# Patient Record
Sex: Female | Born: 1980 | Race: Black or African American | Hispanic: No | Smoking: Former smoker
Health system: Southern US, Community
[De-identification: ages and names within clinical notes are randomized; demographics above are authoritative.]

## PROBLEM LIST (undated history)

## (undated) DIAGNOSIS — Z789 Other specified health status: Secondary | ICD-10-CM

## (undated) DIAGNOSIS — K219 Gastro-esophageal reflux disease without esophagitis: Secondary | ICD-10-CM

## (undated) DIAGNOSIS — K4090 Unilateral inguinal hernia, without obstruction or gangrene, not specified as recurrent: Secondary | ICD-10-CM

## (undated) HISTORY — DX: Gastro-esophageal reflux disease without esophagitis: K21.9

## (undated) HISTORY — DX: Unilateral inguinal hernia, without obstruction or gangrene, not specified as recurrent: K40.90

---

## 2003-09-18 ENCOUNTER — Other Ambulatory Visit: Admission: RE | Admit: 2003-09-18 | Discharge: 2003-09-18 | Payer: Self-pay | Admitting: Gynecology

## 2004-06-25 ENCOUNTER — Other Ambulatory Visit: Admission: RE | Admit: 2004-06-25 | Discharge: 2004-06-25 | Payer: Self-pay | Admitting: Obstetrics and Gynecology

## 2016-12-15 HISTORY — PX: INGUINAL HERNIA REPAIR: SUR1180

## 2017-12-15 HISTORY — PX: SHOULDER SURGERY: SHX246

## 2020-05-10 ENCOUNTER — Other Ambulatory Visit: Payer: Self-pay

## 2020-05-10 ENCOUNTER — Ambulatory Visit: Payer: Medicaid Other | Admitting: Obstetrics and Gynecology

## 2020-05-10 ENCOUNTER — Encounter: Payer: Self-pay | Admitting: Obstetrics and Gynecology

## 2020-05-10 VITALS — BP 120/76 | HR 80 | Ht 64.0 in | Wt 173.5 lb

## 2020-05-10 DIAGNOSIS — N644 Mastodynia: Secondary | ICD-10-CM

## 2020-05-10 DIAGNOSIS — R635 Abnormal weight gain: Secondary | ICD-10-CM

## 2020-05-10 DIAGNOSIS — N938 Other specified abnormal uterine and vaginal bleeding: Secondary | ICD-10-CM | POA: Diagnosis not present

## 2020-05-10 DIAGNOSIS — R102 Pelvic and perineal pain: Secondary | ICD-10-CM | POA: Diagnosis not present

## 2020-05-10 NOTE — Progress Notes (Signed)
Patient presents as New Patient. She complains of having lower abdominal pain, bilateral breast tenderness and swelling. She states that she was diagnosed with endometriosis in 2018 in the [employer]. She also complains of having vaginal discharge.

## 2020-05-10 NOTE — Progress Notes (Signed)
Patient ID: Katrina Hurley, female   DOB: 1981/09/09, 39 y.o.   MRN: [id number] Katrina Hurley presents with several compliants today.  She has noted a 45 # weight gain since November. Increase in her dress size and abd girth. She denies any N/V, bowel or bladder dysfunction.  She also reports irregular cycles until this past November. Cycles since have been monthly and last 4-7 days. Currently on her cycle now. She states no overly heavy or painful. She does report a H/O ovarian cysts in the past as well as ? Dx of endometriosis in the past. Has tried OCP's in the past for cycle control, but noted no difference.  She also reports bilateral breast pain and swelling for the last several months. Denies any nipple discharge, injury, excessive caffeine use or FH of breast CA.   Not sexual active at present. H/O GC/C at age 74  H/O TSVD x 1 and C section d/t placental abruption  Last pap several yrs ago.  PE AF VSS Lungs clear Heart RRR Abd soft + BS  A/P Weight gain        Breast Pain         Abd/pelvic pain         H/O DUB         ? Endometriosis  Will check labs. GYN U/S. Dx mammogram F/U in 4 weeks for review of test results, and yearly GYN exam

## 2020-05-10 NOTE — Patient Instructions (Signed)
Health Maintenance, Female Adopting a healthy lifestyle and getting preventive care are important in promoting health and wellness. Ask your health care provider about:  The right schedule for you to have regular tests and exams.  Things you can do on your own to prevent diseases and keep yourself healthy. What should I know about diet, weight, and exercise? Eat a healthy diet   Eat a diet that includes plenty of vegetables, fruits, low-fat dairy products, and lean protein.  Do not eat a lot of foods that are high in solid fats, added sugars, or sodium. Maintain a healthy weight Body mass index (BMI) is used to identify weight problems. It estimates body fat based on height and weight. Your health care provider can help determine your BMI and help you achieve or maintain a healthy weight. Get regular exercise Get regular exercise. This is one of the most important things you can do for your health. Most adults should:  Exercise for at least 150 minutes each week. The exercise should increase your heart rate and make you sweat (moderate-intensity exercise).  Do strengthening exercises at least twice a week. This is in addition to the moderate-intensity exercise.  Spend less time sitting. Even light physical activity can be beneficial. Watch cholesterol and blood lipids Have your blood tested for lipids and cholesterol at 38 years of age, then have this test every 5 years. Have your cholesterol levels checked more often if:  Your lipid or cholesterol levels are high.  You are older than 38 years of age.  You are at high risk for heart disease. What should I know about cancer screening? Depending on your health history and family history, you may need to have cancer screening at various ages. This may include screening for:  Breast cancer.  Cervical cancer.  Colorectal cancer.  Skin cancer.  Lung cancer. What should I know about heart disease, diabetes, and high blood  pressure? Blood pressure and heart disease  High blood pressure causes heart disease and increases the risk of stroke. This is more likely to develop in people who have high blood pressure readings, are of African descent, or are overweight.  Have your blood pressure checked: ? Every 3-5 years if you are 18-39 years of age. ? Every year if you are 40 years old or older. Diabetes Have regular diabetes screenings. This checks your fasting blood sugar level. Have the screening done:  Once every three years after age 40 if you are at a normal weight and have a low risk for diabetes.  More often and at a younger age if you are overweight or have a high risk for diabetes. What should I know about preventing infection? Hepatitis B If you have a higher risk for hepatitis B, you should be screened for this virus. Talk with your health care provider to find out if you are at risk for hepatitis B infection. Hepatitis C Testing is recommended for:  Everyone born from 1945 through 1965.  Anyone with known risk factors for hepatitis C. Sexually transmitted infections (STIs)  Get screened for STIs, including gonorrhea and chlamydia, if: ? You are sexually active and are younger than 38 years of age. ? You are older than 38 years of age and your health care provider tells you that you are at risk for this type of infection. ? Your sexual activity has changed since you were last screened, and you are at increased risk for chlamydia or gonorrhea. Ask your health care provider if   you are at risk.  Ask your health care provider about whether you are at high risk for HIV. Your health care provider may recommend a prescription medicine to help prevent HIV infection. If you choose to take medicine to prevent HIV, you should first get tested for HIV. You should then be tested every 3 months for as long as you are taking the medicine. Pregnancy  If you are about to stop having your period (premenopausal) and  you may become pregnant, seek counseling before you get pregnant.  Take 400 to 800 micrograms (mcg) of folic acid every day if you become pregnant.  Ask for birth control (contraception) if you want to prevent pregnancy. Osteoporosis and menopause Osteoporosis is a disease in which the bones lose minerals and strength with aging. This can result in bone fractures. If you are 65 years old or older, or if you are at risk for osteoporosis and fractures, ask your health care provider if you should:  Be screened for bone loss.  Take a calcium or vitamin D supplement to lower your risk of fractures.  Be given hormone replacement therapy (HRT) to treat symptoms of menopause. Follow these instructions at home: Lifestyle  Do not use any products that contain nicotine or tobacco, such as cigarettes, e-cigarettes, and chewing tobacco. If you need help quitting, ask your health care provider.  Do not use street drugs.  Do not share needles.  Ask your health care provider for help if you need support or information about quitting drugs. Alcohol use  Do not drink alcohol if: ? Your health care provider tells you not to drink. ? You are pregnant, may be pregnant, or are planning to become pregnant.  If you drink alcohol: ? Limit how much you use to 0-1 drink a day. ? Limit intake if you are breastfeeding.  Be aware of how much alcohol is in your drink. In the [place] one drink equals one 12 oz bottle of beer (355 mL), one 5 oz glass of wine (148 mL), or one 1 oz glass of hard liquor (44 mL). General instructions  Schedule regular health, dental, and eye exams.  Stay current with your vaccines.  Tell your health care provider if: ? You often feel depressed. ? You have ever been abused or do not feel safe at home. Summary  Adopting a healthy lifestyle and getting preventive care are important in promoting health and wellness.  Follow your health care provider's instructions about healthy  diet, exercising, and getting tested or screened for diseases.  Follow your health care provider's instructions on monitoring your cholesterol and blood pressure. This information is not intended to replace advice given to you by your health care provider. Make sure you discuss any questions you have with your health care provider. Document Revised: 11/24/2018 Document Reviewed: 11/24/2018 Elsevier Patient Education  2020 Elsevier Inc.  

## 2020-05-11 LAB — HEMOGLOBIN A1C
Est. average glucose Bld gHb Est-mCnc: 103 mg/dL
Hgb A1c MFr Bld: 5.2 % (ref 4.8–5.6)

## 2020-05-11 LAB — COMPREHENSIVE METABOLIC PANEL
ALT: 15 IU/L (ref 0–32)
AST: 16 IU/L (ref 0–40)
Albumin/Globulin Ratio: 1.7 (ref 1.2–2.2)
Albumin: 4.3 g/dL (ref 3.8–4.8)
Alkaline Phosphatase: 53 IU/L (ref 48–121)
BUN/Creatinine Ratio: 14 (ref 9–23)
BUN: 11 mg/dL (ref 6–20)
Bilirubin Total: 0.5 mg/dL (ref 0.0–1.2)
CO2: 26 mmol/L (ref 20–29)
Calcium: 9.4 mg/dL (ref 8.7–10.2)
Chloride: 103 mmol/L (ref 96–106)
Creatinine, Ser: 0.81 mg/dL (ref 0.57–1.00)
GFR calc Af Amer: 107 mL/min/{1.73_m2} (ref >59)
GFR calc non Af Amer: 93 mL/min/{1.73_m2} (ref >59)
Globulin, Total: 2.5 g/dL (ref 1.5–4.5)
Glucose: 78 mg/dL (ref 65–99)
Potassium: 4.1 mmol/L (ref 3.5–5.2)
Sodium: 141 mmol/L (ref 134–144)
Total Protein: 6.8 g/dL (ref 6.0–8.5)

## 2020-05-11 LAB — CBC
Hematocrit: 37.8 % (ref 34.0–46.6)
Hemoglobin: 12.4 g/dL (ref 11.1–15.9)
MCH: 25.8 pg — ABNORMAL LOW (ref 26.6–33.0)
MCHC: 32.8 g/dL (ref 31.5–35.7)
MCV: 79 fL (ref 79–97)
Platelets: 300 10*3/uL (ref 150–450)
RBC: 4.81 x10E6/uL (ref 3.77–5.28)
RDW: 13 % (ref 11.7–15.4)
WBC: 5.5 10*3/uL (ref 3.4–10.8)

## 2020-05-11 LAB — TSH: TSH: 1.39 u[IU]/mL (ref 0.450–4.500)

## 2020-05-18 ENCOUNTER — Ambulatory Visit (HOSPITAL_COMMUNITY): Payer: Medicaid Other

## 2020-06-01 ENCOUNTER — Ambulatory Visit (HOSPITAL_COMMUNITY)
Admission: RE | Admit: 2020-06-01 | Discharge: 2020-06-01 | Disposition: A | Discharge status: Home / Self Care | Payer: Medicaid Other | Source: Ambulatory Visit | Attending: Obstetrics and Gynecology | Admitting: Obstetrics and Gynecology

## 2020-06-01 DIAGNOSIS — R102 Pelvic and perineal pain: Secondary | ICD-10-CM | POA: Diagnosis not present

## 2020-06-05 ENCOUNTER — Telehealth: Payer: Self-pay

## 2020-06-05 NOTE — Telephone Encounter (Signed)
Pt left a vm requesting results from recent ultrasound on 05/06/20.   I consulted with with Dr. Donavan Foil.  "Small cyst noted on the right ovary. This is likely causing her pain and discomfort.  She can take tylenol and use a heating pad/warm compresses PRN.  Keep f/u in 06/2020.  If she feels that she needs to be seen sooner that is ok as well."  Pt verbalized understanding. -EH/RMA

## 2020-07-05 ENCOUNTER — Ambulatory Visit: Payer: Medicaid Other | Admitting: Obstetrics and Gynecology

## 2020-07-10 ENCOUNTER — Encounter: Payer: Self-pay | Admitting: Obstetrics and Gynecology

## 2020-07-10 ENCOUNTER — Other Ambulatory Visit (HOSPITAL_COMMUNITY)
Admission: RE | Admit: 2020-07-10 | Discharge: 2020-07-10 | Disposition: A | Discharge status: Home / Self Care | Payer: Medicaid Other | Source: Ambulatory Visit | Attending: Obstetrics and Gynecology | Admitting: Obstetrics and Gynecology

## 2020-07-10 ENCOUNTER — Other Ambulatory Visit: Payer: Self-pay

## 2020-07-10 ENCOUNTER — Ambulatory Visit: Payer: Medicaid Other | Admitting: Obstetrics and Gynecology

## 2020-07-10 VITALS — BP 107/67 | HR 76 | Ht 64.0 in | Wt 182.2 lb

## 2020-07-10 DIAGNOSIS — N83202 Unspecified ovarian cyst, left side: Secondary | ICD-10-CM

## 2020-07-10 DIAGNOSIS — Z01419 Encounter for gynecological examination (general) (routine) without abnormal findings: Secondary | ICD-10-CM | POA: Insufficient documentation

## 2020-07-10 DIAGNOSIS — R635 Abnormal weight gain: Secondary | ICD-10-CM | POA: Diagnosis not present

## 2020-07-10 DIAGNOSIS — N83209 Unspecified ovarian cyst, unspecified side: Secondary | ICD-10-CM | POA: Insufficient documentation

## 2020-07-10 DIAGNOSIS — N938 Other specified abnormal uterine and vaginal bleeding: Secondary | ICD-10-CM | POA: Diagnosis not present

## 2020-07-10 DIAGNOSIS — N644 Mastodynia: Secondary | ICD-10-CM

## 2020-07-10 DIAGNOSIS — R102 Pelvic and perineal pain: Secondary | ICD-10-CM | POA: Diagnosis not present

## 2020-07-10 DIAGNOSIS — Z3202 Encounter for pregnancy test, result negative: Secondary | ICD-10-CM | POA: Diagnosis not present

## 2020-07-10 LAB — POCT URINE PREGNANCY: Preg Test, Ur: NEGATIVE

## 2020-07-10 MED ORDER — DESOGESTREL-ETHINYL ESTRADIOL 0.15-30 MG-MCG PO TABS: 1.0000 | ORAL_TABLET | Freq: Every day | ORAL | 11 refills | Status: DC

## 2020-07-10 NOTE — Progress Notes (Signed)
Pt presents for follow up lab work and pelvic u/s. Annual and pap due today Pt c/o excessive weight gain, bilateral breast "soreness, and abdominal pain "feels like contractions." She also feels like something is expelling out of her vagina.

## 2020-07-10 NOTE — Patient Instructions (Signed)
Health Maintenance, Female Adopting a healthy lifestyle and getting preventive care are important in promoting health and wellness. Ask your health care provider about:  The right schedule for you to have regular tests and exams.  Things you can do on your own to prevent diseases and keep yourself healthy. What should I know about diet, weight, and exercise? Eat a healthy diet   Eat a diet that includes plenty of vegetables, fruits, low-fat dairy products, and lean protein.  Do not eat a lot of foods that are high in solid fats, added sugars, or sodium. Maintain a healthy weight Body mass index (BMI) is used to identify weight problems. It estimates body fat based on height and weight. Your health care provider can help determine your BMI and help you achieve or maintain a healthy weight. Get regular exercise Get regular exercise. This is one of the most important things you can do for your health. Most adults should:  Exercise for at least 150 minutes each week. The exercise should increase your heart rate and make you sweat (moderate-intensity exercise).  Do strengthening exercises at least twice a week. This is in addition to the moderate-intensity exercise.  Spend less time sitting. Even light physical activity can be beneficial. Watch cholesterol and blood lipids Have your blood tested for lipids and cholesterol at 38 years of age, then have this test every 5 years. Have your cholesterol levels checked more often if:  Your lipid or cholesterol levels are high.  You are older than 38 years of age.  You are at high risk for heart disease. What should I know about cancer screening? Depending on your health history and family history, you may need to have cancer screening at various ages. This may include screening for:  Breast cancer.  Cervical cancer.  Colorectal cancer.  Skin cancer.  Lung cancer. What should I know about heart disease, diabetes, and high blood  pressure? Blood pressure and heart disease  High blood pressure causes heart disease and increases the risk of stroke. This is more likely to develop in people who have high blood pressure readings, are of African descent, or are overweight.  Have your blood pressure checked: ? Every 3-5 years if you are 18-39 years of age. ? Every year if you are 40 years old or older. Diabetes Have regular diabetes screenings. This checks your fasting blood sugar level. Have the screening done:  Once every three years after age 40 if you are at a normal weight and have a low risk for diabetes.  More often and at a younger age if you are overweight or have a high risk for diabetes. What should I know about preventing infection? Hepatitis B If you have a higher risk for hepatitis B, you should be screened for this virus. Talk with your health care provider to find out if you are at risk for hepatitis B infection. Hepatitis C Testing is recommended for:  Everyone born from 1945 through 1965.  Anyone with known risk factors for hepatitis C. Sexually transmitted infections (STIs)  Get screened for STIs, including gonorrhea and chlamydia, if: ? You are sexually active and are younger than 38 years of age. ? You are older than 38 years of age and your health care provider tells you that you are at risk for this type of infection. ? Your sexual activity has changed since you were last screened, and you are at increased risk for chlamydia or gonorrhea. Ask your health care provider if   you are at risk.  Ask your health care provider about whether you are at high risk for HIV. Your health care provider may recommend a prescription medicine to help prevent HIV infection. If you choose to take medicine to prevent HIV, you should first get tested for HIV. You should then be tested every 3 months for as long as you are taking the medicine. Pregnancy  If you are about to stop having your period (premenopausal) and  you may become pregnant, seek counseling before you get pregnant.  Take 400 to 800 micrograms (mcg) of folic acid every day if you become pregnant.  Ask for birth control (contraception) if you want to prevent pregnancy. Osteoporosis and menopause Osteoporosis is a disease in which the bones lose minerals and strength with aging. This can result in bone fractures. If you are 65 years old or older, or if you are at risk for osteoporosis and fractures, ask your health care provider if you should:  Be screened for bone loss.  Take a calcium or vitamin D supplement to lower your risk of fractures.  Be given hormone replacement therapy (HRT) to treat symptoms of menopause. Follow these instructions at home: Lifestyle  Do not use any products that contain nicotine or tobacco, such as cigarettes, e-cigarettes, and chewing tobacco. If you need help quitting, ask your health care provider.  Do not use street drugs.  Do not share needles.  Ask your health care provider for help if you need support or information about quitting drugs. Alcohol use  Do not drink alcohol if: ? Your health care provider tells you not to drink. ? You are pregnant, may be pregnant, or are planning to become pregnant.  If you drink alcohol: ? Limit how much you use to 0-1 drink a day. ? Limit intake if you are breastfeeding.  Be aware of how much alcohol is in your drink. In the [place] one drink equals one 12 oz bottle of beer (355 mL), one 5 oz glass of wine (148 mL), or one 1 oz glass of hard liquor (44 mL). General instructions  Schedule regular health, dental, and eye exams.  Stay current with your vaccines.  Tell your health care provider if: ? You often feel depressed. ? You have ever been abused or do not feel safe at home. Summary  Adopting a healthy lifestyle and getting preventive care are important in promoting health and wellness.  Follow your health care provider's instructions about healthy  diet, exercising, and getting tested or screened for diseases.  Follow your health care provider's instructions on monitoring your cholesterol and blood pressure. This information is not intended to replace advice given to you by your health care provider. Make sure you discuss any questions you have with your health care provider. Document Revised: 11/24/2018 Document Reviewed: 11/24/2018 Elsevier Patient Education  2020 Elsevier Inc.  

## 2020-07-10 NOTE — Progress Notes (Signed)
Patient ID: Katrina Hurley, female   DOB: 1981-08-20, 39 y.o.   MRN: [id number]  Katrina Hurley is a 39 y.o. G86P2002 female here for a routine annual gynecologic exam. She continues to have problems with bilateral breast pain. Has not had Dx mammogram yet. Cycles are irregular now. Last IC this past March. Feels bloated. 9 # wt gain since last visit. Labs from last visit normal. U/S normal except for left ovarian cyst.    Has used OCP's in the past without problems   Gynecologic History Patient's last menstrual period was 06/19/2020. Contraception: none   Obstetric History OB History  Gravida Para Term Preterm AB Living  2 2 2     2   SAB TAB Ectopic Multiple Live Births          2    # Outcome Date GA Lbr Len/2nd Weight Sex Delivery Anes PTL Lv  2 Term 07/17/10     CS-LTranv   LIV  1 Term 12/02/08     Vag-Spont   LIV    History reviewed. No pertinent past medical history.  Past Surgical History:  Procedure Laterality Date  . CESAREAN SECTION  07/2010  . INGUINAL HERNIA REPAIR  2018  . SHOULDER SURGERY Left 2019    Current Outpatient Medications on File Prior to Visit  Medication Sig Dispense Refill  . Multiple Vitamins-Minerals (MULTIVITAMIN GUMMIES WOMENS) CHEW Chew by mouth.     No current facility-administered medications on file prior to visit.    Allergies  Allergen Reactions  . Prunus Persica Anaphylaxis  . Shellfish Allergy Anaphylaxis    Social History   Socioeconomic History  . Marital status: Divorced    Spouse name: Not on file  . Number of children: Not on file  . Years of education: Not on file  . Highest education level: Not on file  Occupational History  . Not on file  Tobacco Use  . Smoking status: Former Smoker    Types: Cigarettes    Quit date: 2012    Years since quitting: 9.5  . Smokeless tobacco: Never Used  Vaping Use  . Vaping Use: Never used  Substance and Sexual Activity  . Alcohol use: Not Currently  . Drug use: Not Currently  .  Sexual activity: Not Currently    Partners: Male    Birth control/protection: None  Other Topics Concern  . Not on file  Social History Narrative  . Not on file   Social Determinants of Health   Financial Resource Strain:   . Difficulty of Paying Living Expenses:   Food Insecurity:   . Worried About 2013 in the Last Year:   . Programme researcher, broadcasting/film/video in the Last Year:   Transportation Needs:   . Barista (Medical):   Freight forwarder Lack of Transportation (Non-Medical):   Physical Activity:   . Days of Exercise per Week:   . Minutes of Exercise per Session:   Stress:   . Feeling of Stress :   Social Connections:   . Frequency of Communication with Friends and Family:   . Frequency of Social Gatherings with Friends and Family:   . Attends Religious Services:   . Active Member of Clubs or Organizations:   . Attends [employer] Meetings:   Banker Marital Status:   Intimate Partner Violence:   . Fear of Current or Ex-Partner:   . Emotionally Abused:   [employer] Physically Abused:   . Sexually Abused:  History reviewed. No pertinent family history.  The following portions of the patient's history were reviewed and updated as appropriate: allergies, current medications, past family history, past medical history, past social history, past surgical history and problem list.  Review of Systems Pertinent items noted in HPI and remainder of comprehensive ROS otherwise negative.   Objective:  BP 107/67   Pulse 76   Ht 5\' 4"  (1.626 m)   Wt 182 lb 3.2 oz (82.6 kg)   LMP 06/19/2020   BMI 31.27 kg/m  CONSTITUTIONAL: Well-developed, well-nourished female in no acute distress.  HENT:  Normocephalic, atraumatic, External right and left ear normal. Oropharynx is clear and moist EYES: Conjunctivae and EOM are normal. Pupils are equal, round, and reactive to light. No scleral icterus.  NECK: Normal range of motion, supple, no masses.  Normal thyroid.  SKIN: Skin is warm and  dry. No rash noted. Not diaphoretic. No erythema. No pallor. NEUROLGIC: Alert and oriented to person, place, and time. Normal reflexes, muscle tone coordination. No cranial nerve deficit noted. PSYCHIATRIC: Normal mood and affect. Normal behavior. Normal judgment and thought content. CARDIOVASCULAR: Normal heart rate noted, regular rhythm RESPIRATORY: Clear to auscultation bilaterally. Effort and breath sounds normal, no problems with respiration noted. BREASTS: Symmetric in size. No masses, skin changes, nipple drainage, or lymphadenopathy. ABDOMEN: Soft, normal bowel sounds, no distention noted.  No tenderness, rebound or guarding.  PELVIC: Normal appearing external genitalia; normal appearing vaginal mucosa and cervix.  No abnormal discharge noted.  Pap smear obtained.  Normal uterine size, no other palpable masses, no uterine or adnexal tenderness. MUSCULOSKELETAL: Normal range of motion. No tenderness.  No cyanosis, clubbing, or edema.  2+ distal pulses.   Assessment:  Annual gynecologic examination with pap smear DUB Breast tenderness Left ovarian cyst Plan:  Will follow up results of pap smear and manage accordingly. U/S findings reviewed with pt. F/U U/S ordered. Suspect Sx partially hormonally related. Discussed OCP's to help regulate cycles and hormones. Pt agreeable. Will start OCP's. U/R/B and back up method reviewed with pt. Dx mammogram rescheudle Routine preventative health maintenance measures emphasized. Please refer to After Visit Summary for other counseling recommendations.  F/U in 3-4 months  08/20/2020, MD, FACOG Attending Obstetrician & Gynecologist Center for [facility], [facility]

## 2020-07-10 NOTE — Addendum Note (Signed)
Addended by: Dalphine Handing on: 07/10/2020 03:19 PM   Modules accepted: Orders

## 2020-07-11 LAB — CERVICOVAGINAL ANCILLARY ONLY
Bacterial Vaginitis (gardnerella): NEGATIVE
Candida Glabrata: NEGATIVE
Candida Vaginitis: POSITIVE — AB
Chlamydia: NEGATIVE
Comment: NEGATIVE
Comment: NEGATIVE
Comment: NEGATIVE
Comment: NEGATIVE
Comment: NEGATIVE
Comment: NORMAL
Neisseria Gonorrhea: NEGATIVE
Trichomonas: NEGATIVE

## 2020-07-12 ENCOUNTER — Other Ambulatory Visit: Payer: Self-pay

## 2020-07-12 DIAGNOSIS — B3731 Acute candidiasis of vulva and vagina: Secondary | ICD-10-CM

## 2020-07-12 LAB — CYTOLOGY - PAP
Comment: NEGATIVE
Diagnosis: NEGATIVE
High risk HPV: NEGATIVE

## 2020-07-12 MED ORDER — FLUCONAZOLE 150 MG PO TABS: 150.0000 mg | ORAL_TABLET | Freq: Once | ORAL | 1 refills | Status: AC

## 2020-07-12 NOTE — Progress Notes (Signed)
Rx sent as advised by Dr.Ervin.   

## 2020-07-17 ENCOUNTER — Other Ambulatory Visit: Payer: Self-pay

## 2020-07-17 ENCOUNTER — Ambulatory Visit
Admission: RE | Admit: 2020-07-17 | Discharge: 2020-07-17 | Disposition: A | Discharge status: Home / Self Care | Payer: Medicaid Other | Source: Ambulatory Visit | Attending: Obstetrics and Gynecology | Admitting: Obstetrics and Gynecology

## 2020-07-17 DIAGNOSIS — N83202 Unspecified ovarian cyst, left side: Secondary | ICD-10-CM | POA: Diagnosis present

## 2021-12-24 IMAGING — US US PELVIS COMPLETE WITH TRANSVAGINAL
1 series · 13 of 25 positions shown · non-contrast
Comparison: None

CLINICAL DATA: Abdomen and pelvic pain

EXAM:
TRANSABDOMINAL AND TRANSVAGINAL ULTRASOUND OF PELVIS
TECHNIQUE: Both transabdominal and transvaginal ultrasound examinations of the
pelvis were performed. Transabdominal technique was performed for
global imaging of the pelvis including uterus, ovaries, adnexal
regions, and pelvic cul-de-sac. It was necessary to proceed with
endovaginal exam following the transabdominal exam to visualize the
uterus endometrium ovaries.

[Series 1: us pelvis complete with transvaginal · 13 of 103 slices shown]
[im 1/103]
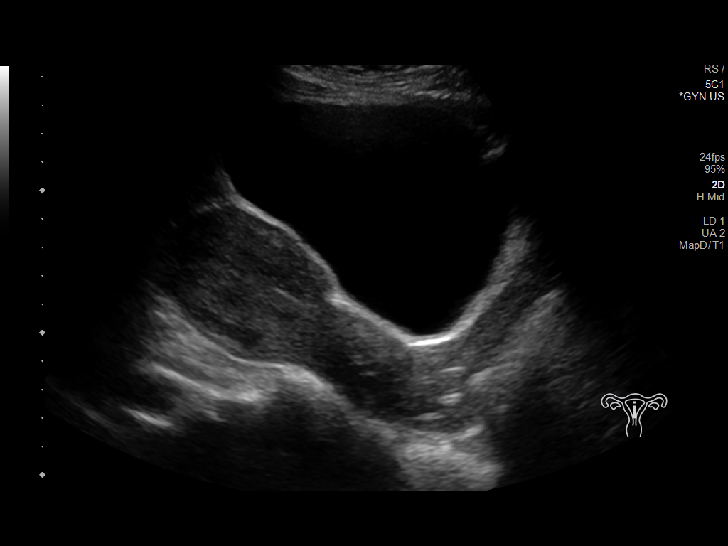
[im 9/103]
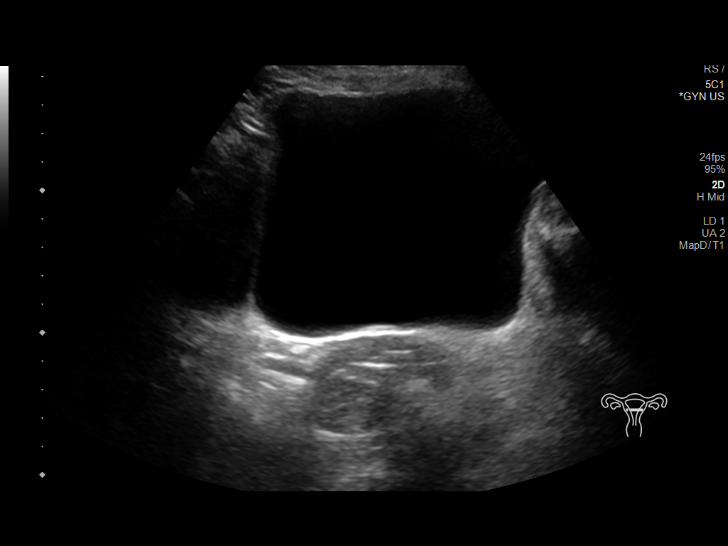
[im 18/103]
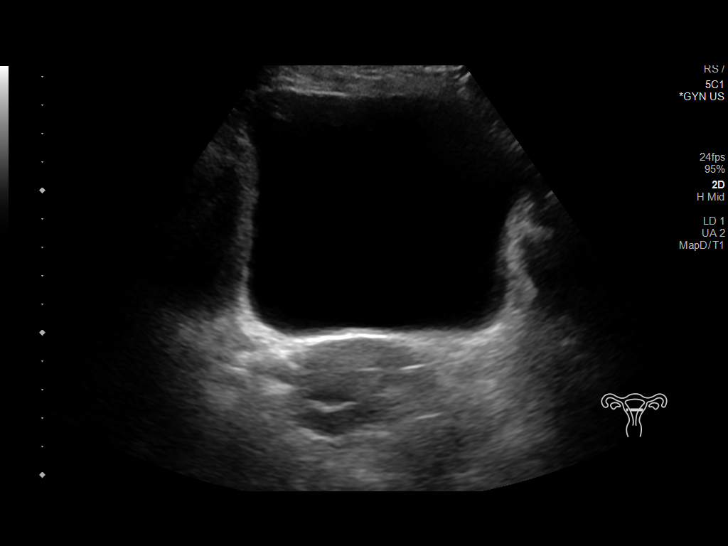
[im 26/103]
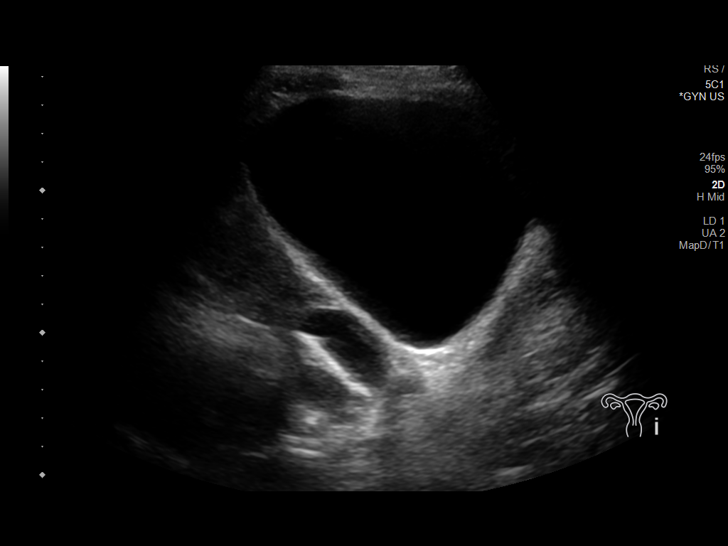
[im 35/103]
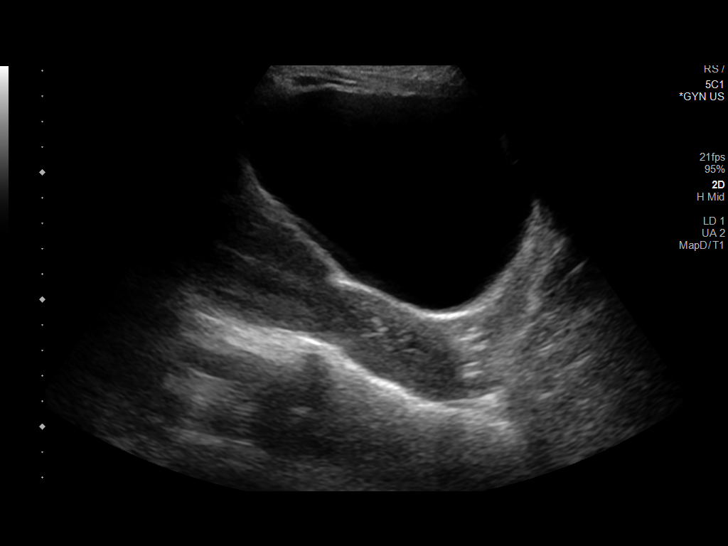
[im 43/103]
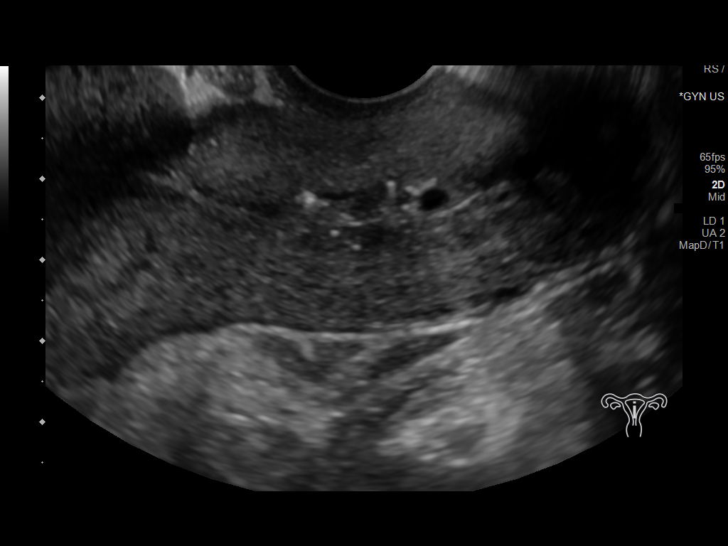
[im 52/103]
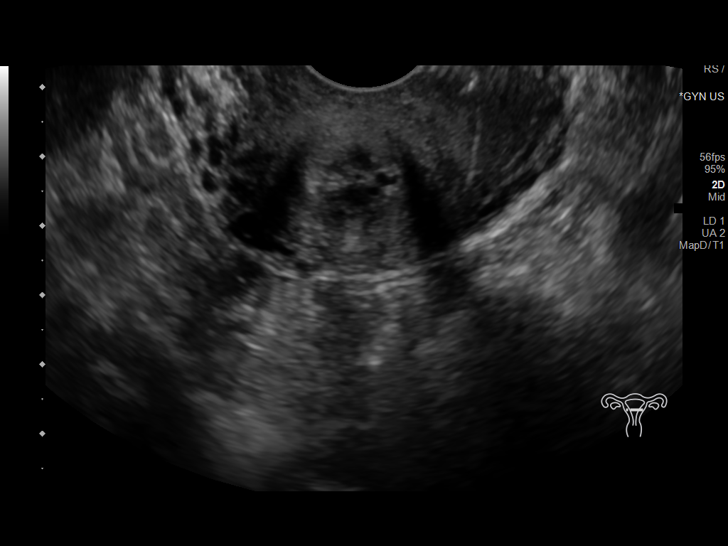
[im 60/103]
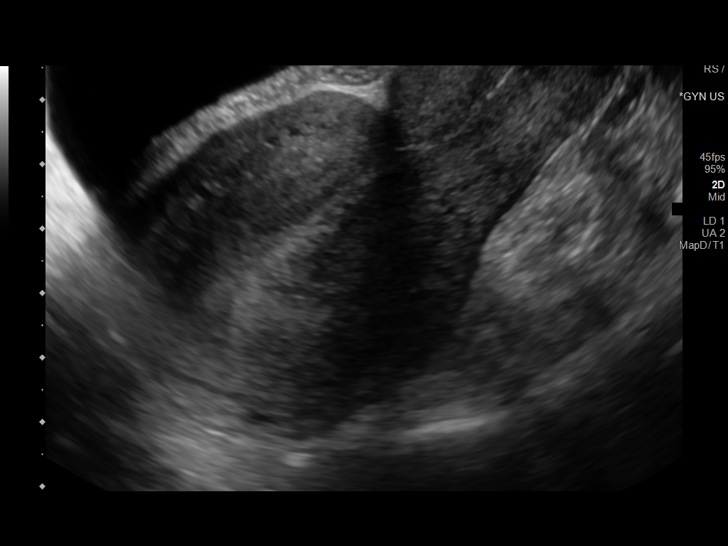
[im 69/103]
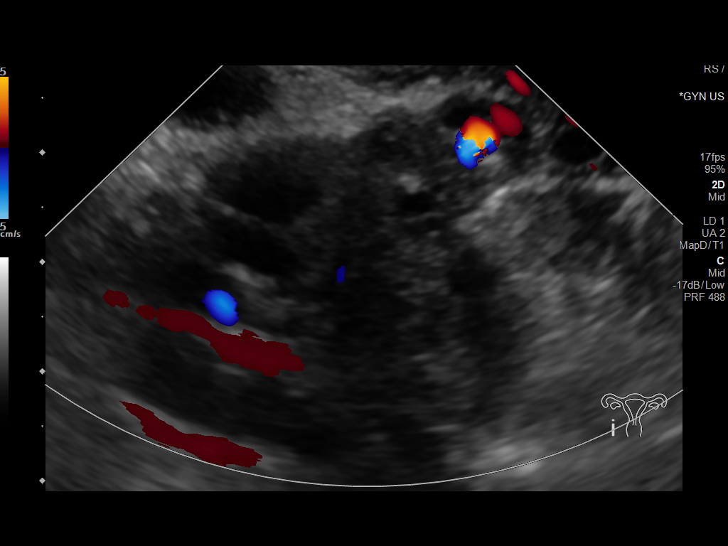
[im 77/103]
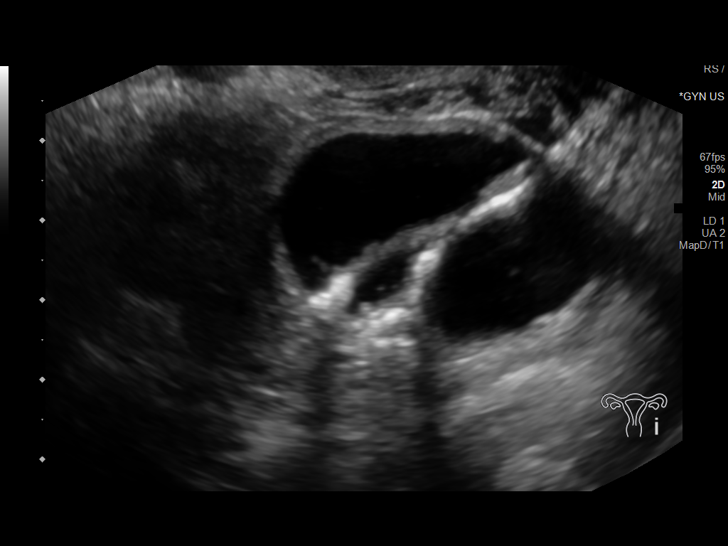
[im 86/103]
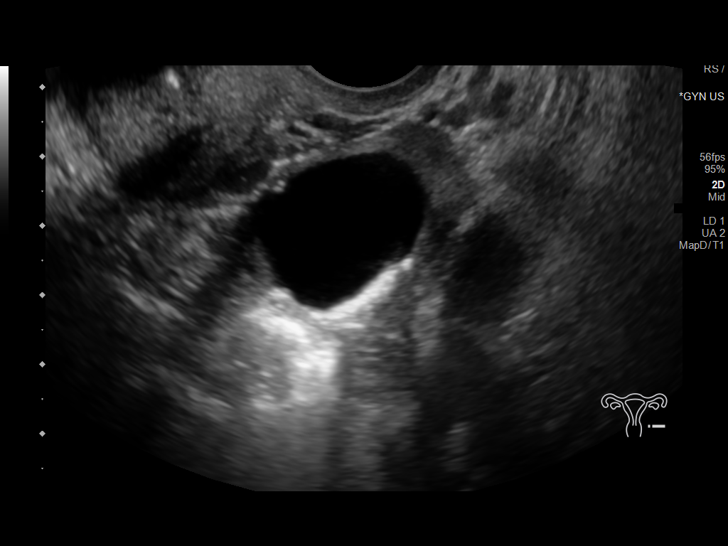
[im 94/103]
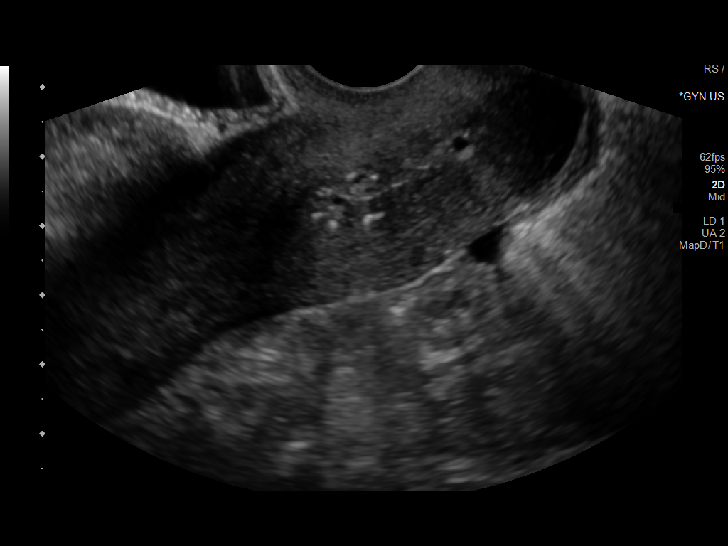
[im 103/103]
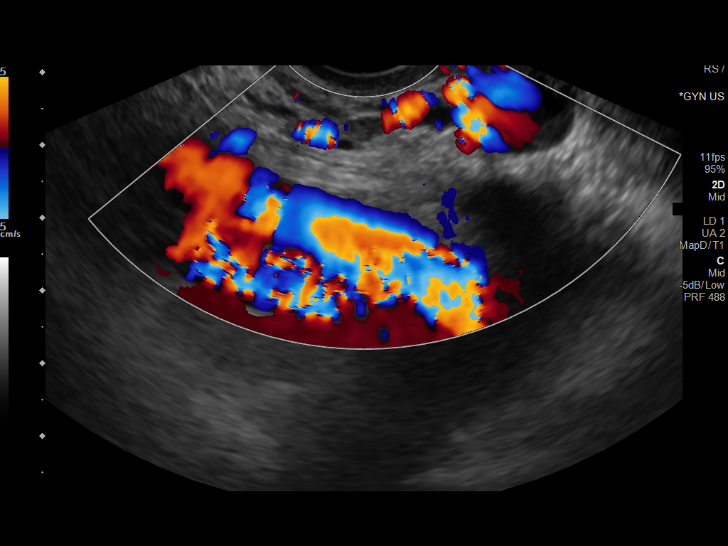

[13 of 25 positions shown; findings below may reference images not displayed]

FINDINGS: Uterus

Measurements: 12.7 x 4.6 x 5.7 cm = volume: 173.6 mL. No fibroids or
other mass visualized.

Endometrium

Thickness: 6 mm.  No focal abnormality visualized.

Right ovary

Measurements: 2.6 x 1.8 x 1.3 cm = volume: 3.2 mL. Normal
appearance/no adnexal mass.

Left ovary

Measurements: 5.1 x 3.7 x 4.6 cm = volume: 45.3 mL. Complex cyst in
the left ovary measuring 4.3 x 3 x 3.2 cm, contains several slightly
thickened avascular septa with areas of increased echogenicity at
the septa periphery of the cyst.

Other findings

Trace free fluid
IMPRESSION: 1. 4.3 cm complex left ovarian cyst with several slightly thickened
septa. Surgical consultation is recommended.
2. Trace free fluid.  Otherwise negative pelvic ultrasound

## 2021-12-26 ENCOUNTER — Other Ambulatory Visit: Payer: Self-pay

## 2021-12-26 ENCOUNTER — Inpatient Hospital Stay (HOSPITAL_COMMUNITY)
Admission: EM | Admit: 2021-12-26 | Discharge: 2021-12-27 | Disposition: A | Discharge status: Home / Self Care | Payer: Medicaid Other | Attending: Obstetrics & Gynecology | Admitting: Obstetrics & Gynecology

## 2021-12-26 ENCOUNTER — Encounter (HOSPITAL_COMMUNITY): Payer: Self-pay | Admitting: Obstetrics & Gynecology

## 2021-12-26 ENCOUNTER — Emergency Department (HOSPITAL_COMMUNITY): Payer: Medicaid Other

## 2021-12-26 ENCOUNTER — Inpatient Hospital Stay (HOSPITAL_COMMUNITY): Payer: Medicaid Other

## 2021-12-26 DIAGNOSIS — K5900 Constipation, unspecified: Secondary | ICD-10-CM | POA: Diagnosis not present

## 2021-12-26 DIAGNOSIS — Z3A10 10 weeks gestation of pregnancy: Secondary | ICD-10-CM | POA: Insufficient documentation

## 2021-12-26 DIAGNOSIS — O26891 Other specified pregnancy related conditions, first trimester: Secondary | ICD-10-CM | POA: Insufficient documentation

## 2021-12-26 DIAGNOSIS — O3481 Maternal care for other abnormalities of pelvic organs, first trimester: Secondary | ICD-10-CM | POA: Insufficient documentation

## 2021-12-26 DIAGNOSIS — O3680X0 Pregnancy with inconclusive fetal viability, not applicable or unspecified: Secondary | ICD-10-CM | POA: Insufficient documentation

## 2021-12-26 DIAGNOSIS — O09521 Supervision of elderly multigravida, first trimester: Secondary | ICD-10-CM | POA: Diagnosis not present

## 2021-12-26 DIAGNOSIS — R102 Pelvic and perineal pain: Secondary | ICD-10-CM | POA: Insufficient documentation

## 2021-12-26 DIAGNOSIS — O99891 Other specified diseases and conditions complicating pregnancy: Secondary | ICD-10-CM | POA: Diagnosis not present

## 2021-12-26 DIAGNOSIS — N83202 Unspecified ovarian cyst, left side: Secondary | ICD-10-CM | POA: Diagnosis not present

## 2021-12-26 DIAGNOSIS — O99611 Diseases of the digestive system complicating pregnancy, first trimester: Secondary | ICD-10-CM | POA: Insufficient documentation

## 2021-12-26 DIAGNOSIS — R1011 Right upper quadrant pain: Secondary | ICD-10-CM | POA: Diagnosis not present

## 2021-12-26 LAB — COMPREHENSIVE METABOLIC PANEL
ALT: 12 U/L (ref 0–44)
AST: 20 U/L (ref 15–41)
Albumin: 3.7 g/dL (ref 3.5–5.0)
Alkaline Phosphatase: 39 U/L (ref 38–126)
Anion gap: 12 (ref 5–15)
BUN: 6 mg/dL (ref 6–20)
CO2: 23 mmol/L (ref 22–32)
Calcium: 9.9 mg/dL (ref 8.9–10.3)
Chloride: 100 mmol/L (ref 98–111)
Creatinine, Ser: 0.61 mg/dL (ref 0.44–1.00)
GFR, Estimated: >60 mL/min (ref >60)
Glucose, Bld: 86 mg/dL (ref 70–99)
Potassium: 3.6 mmol/L (ref 3.5–5.1)
Sodium: 135 mmol/L (ref 135–145)
Total Bilirubin: 0.5 mg/dL (ref 0.3–1.2)
Total Protein: 6.6 g/dL (ref 6.5–8.1)

## 2021-12-26 LAB — URINALYSIS, MICROSCOPIC (REFLEX)
Bacteria, UA: NONE SEEN
WBC, UA: NONE SEEN WBC/hpf (ref 0–5)

## 2021-12-26 LAB — URINALYSIS, ROUTINE W REFLEX MICROSCOPIC
Bilirubin Urine: NEGATIVE
Glucose, UA: NEGATIVE mg/dL
Ketones, ur: NEGATIVE mg/dL
Leukocytes,Ua: NEGATIVE
Nitrite: NEGATIVE
Protein, ur: NEGATIVE mg/dL
Specific Gravity, Urine: <1.005 — ABNORMAL LOW (ref 1.005–1.030)
pH: 6 (ref 5.0–8.0)

## 2021-12-26 LAB — LIPASE, BLOOD: Lipase: 28 U/L (ref 11–51)

## 2021-12-26 LAB — CBC WITH DIFFERENTIAL/PLATELET
Abs Immature Granulocytes: 0.01 10*3/uL (ref 0.00–0.07)
Basophils Absolute: 0 10*3/uL (ref 0.0–0.1)
Basophils Relative: 0 %
Eosinophils Absolute: 0 10*3/uL (ref 0.0–0.5)
Eosinophils Relative: 1 %
HCT: 34.2 % — ABNORMAL LOW (ref 36.0–46.0)
Hemoglobin: 11.4 g/dL — ABNORMAL LOW (ref 12.0–15.0)
Immature Granulocytes: 0 %
Lymphocytes Relative: 27 %
Lymphs Abs: 1.5 10*3/uL (ref 0.7–4.0)
MCH: 25.3 pg — ABNORMAL LOW (ref 26.0–34.0)
MCHC: 33.3 g/dL (ref 30.0–36.0)
MCV: 75.8 fL — ABNORMAL LOW (ref 80.0–100.0)
Monocytes Absolute: 0.5 10*3/uL (ref 0.1–1.0)
Monocytes Relative: 9 %
Neutro Abs: 3.5 10*3/uL (ref 1.7–7.7)
Neutrophils Relative %: 63 %
Platelets: 309 10*3/uL (ref 150–400)
RBC: 4.51 MIL/uL (ref 3.87–5.11)
RDW: 14.2 % (ref 11.5–15.5)
WBC: 5.5 10*3/uL (ref 4.0–10.5)
nRBC: 0 % (ref 0.0–0.2)

## 2021-12-26 LAB — I-STAT BETA HCG BLOOD, ED (MC, WL, AP ONLY): I-stat hCG, quantitative: >2000 m[IU]/mL — ABNORMAL HIGH (ref <5)

## 2021-12-26 LAB — HCG, QUANTITATIVE, PREGNANCY: hCG, Beta Chain, Quant, S: 164312 m[IU]/mL — ABNORMAL HIGH (ref <5)

## 2021-12-26 NOTE — MAU Note (Signed)
PT SAYS SHE WENT TO [facility] AT 730PM- FOR MIDDLE OF ABD PAIN- TOOK XS TYLENOL 1 TAB - SOME RELIEF WAS  AT WORK -   NO VB

## 2021-12-26 NOTE — ED Provider Triage Note (Signed)
Emergency Medicine Provider Triage Evaluation Note  Katrina Hurley , a 41 y.o. female  was evaluated in triage.  Pt complains of abdominal pain.  Patient states pain started this morning.  Pain is in the right upper quadrant but also a little bit in the epigastric region.  It has been constant.  No associated with eating.  Says she also feels constipated.  She did have a bowel movement today, however it was very painful to get out.  Denies any nausea or vomiting  Review of Systems  Positive:  Negative:   Physical Exam  BP 122/79 (BP Location: Right Arm)    Pulse 75    Temp 97.9 F (36.6 C)    Resp 18    SpO2 99%  Gen:   Awake, no distress   Resp:  Normal effort  MSK:   Moves extremities without difficulty  Other:  RUQ ttp  Medical Decision Making  Medically screening exam initiated at 8:25 PM.  Appropriate orders placed.  [place] Creswell was informed that the remainder of the evaluation will be completed by another provider, this initial triage assessment does not replace that evaluation, and the importance of remaining in the ED until their evaluation is complete.  Abdominal labs, RUQ [place], abd XR   Claudie Leach, PA-C 12/26/21 2026

## 2021-12-26 NOTE — ED Triage Notes (Signed)
Pt reported to ED with c/o generalized abdominal pain and nausea that has been ongoing throughout the day. Pt states she feels like she is "constipated" because she has strained while attempting to generate BM. States excrement was a mixture of soft, hard and clumps. Pt also reports epigastric/ RUQ pain that increases when she walk or moves/changes positions.

## 2021-12-26 NOTE — ED Notes (Signed)
Pt called 3x no answer  

## 2021-12-26 NOTE — ED Provider Notes (Signed)
Emergency Medicine Provider OB Triage Evaluation Note  Katrina Hurley is a 42 y.o. female, G2P2002, at Unknown gestation who presents to the emergency department with complaints of abdominal pain and nausea.  Pain began today, she has been nauseous for about a week.  Pain worsens after she eats.  It is periumbilical and towards the right lower area.  She has vaginal discharge which she states is baseline, no change.  No vaginal bleeding.  She is sexually active does not use condoms or contraception.  She has a history of endometriosis.  Last period was sometime in December, but she is normally irregular. 2 previous pregnancies.   Labs obtained in triage shows hCG greater than 2000.  Review of  Systems  Positive: abd pain, nausea Negative: vaginal bleeding  Physical Exam  BP 122/79 (BP Location: Right Arm)    Pulse 75    Temp 97.9 F (36.6 C)    Resp 18    Ht 5\' 4"  (1.626 m)    Wt 81.6 kg    SpO2 99%    BMI 30.90 kg/m  General: Awake, no distress  HEENT: Atraumatic  Resp: Normal effort  Cardiac: Normal rate Abd: Nondistended, mild tenderness palpation of the abdomen MSK: Moves all extremities without difficulty Neuro: Speech clear  Medical Decision Making  Pt evaluated for pregnancy concern and is stable for transfer to MAU. Pt is in agreement with plan for transfer.  10:14 PM Discussed with MAU APP, Lelan Pons, who accepts patient in transfer.  Clinical Impression   1. RUQ abdominal pain        Franchot Heidelberg, PA-C 12/26/21 2217    Fredia Sorrow, MD 01/02/22 [phone]

## 2021-12-27 DIAGNOSIS — R1011 Right upper quadrant pain: Secondary | ICD-10-CM

## 2021-12-27 DIAGNOSIS — Z3A10 10 weeks gestation of pregnancy: Secondary | ICD-10-CM

## 2021-12-27 DIAGNOSIS — O99891 Other specified diseases and conditions complicating pregnancy: Secondary | ICD-10-CM

## 2021-12-27 DIAGNOSIS — K5900 Constipation, unspecified: Secondary | ICD-10-CM

## 2021-12-27 MED ORDER — POLYETHYLENE GLYCOL 3350 17 G PO PACK: 17.0000 g | PACK | Freq: Every day | ORAL | 0 refills | Status: AC

## 2021-12-31 NOTE — MAU Provider Note (Signed)
Chief Complaint: Abdominal Pain   None       SUBJECTIVE HPI: Katrina Hurley is a 41 y.o. G3P2002 at [redacted]w[redacted]d by LMP who presents to maternity admissions reporting constipation and middle abdominal pain.  No bleeding. She denies vaginal bleeding, vaginal itching/burning, urinary symptoms, h/a, dizziness, n/v, or fever/chills.    Abdominal Pain This is a new problem. The current episode started in the past 7 days. The onset quality is gradual. The problem has been unchanged. The pain is located in the generalized abdominal region and periumbilical region. The quality of the pain is cramping. The abdominal pain does not radiate. Associated symptoms include constipation. Pertinent negatives include no diarrhea, dysuria, fever, headaches or myalgias. Nothing aggravates the pain. The pain is relieved by Nothing.  RN note; PT SAYS SHE WENT TO [place] AT 730PM- FOR MIDDLE OF ABD PAIN- TOOK XS TYLENOL 1 TAB - SOME RELIEF WAS  AT WORK -   NO VB   No past medical history on file. Past Surgical History:  Procedure Laterality Date   CESAREAN SECTION  07/2010   INGUINAL HERNIA REPAIR  2018   SHOULDER SURGERY Left 2019   Social History   Socioeconomic History   Marital status: Divorced    Spouse name: Not on file   Number of children: Not on file   Years of education: Not on file   Highest education level: Not on file  Occupational History   Not on file  Tobacco Use   Smoking status: Former    Types: Cigarettes    Quit date: 2012    Years since quitting: 11.0   Smokeless tobacco: Never  Vaping Use   Vaping Use: Never used  Substance and Sexual Activity   Alcohol use: Not Currently   Drug use: Not Currently   Sexual activity: Not Currently    Partners: Male    Birth control/protection: None  Other Topics Concern   Not on file  Social History Narrative   Not on file   Social Determinants of Health   Financial Resource Strain: Not on file  Food Insecurity: Not on file  Transportation  Needs: Not on file  Physical Activity: Not on file  Stress: Not on file  Social Connections: Not on file  Intimate Partner Violence: Not on file   No current facility-administered medications on file prior to encounter.   Current Outpatient Medications on File Prior to Encounter  Medication Sig Dispense Refill   Multiple Vitamins-Minerals (MULTIVITAMIN GUMMIES WOMENS) CHEW Chew 1 tablet by mouth daily.     Allergies  Allergen Reactions   Prunus Persica Anaphylaxis   Shellfish Allergy Anaphylaxis   Shellfish-Derived Products     I have reviewed patient's Past Medical Hx, Surgical Hx, Family Hx, Social Hx, medications and allergies.   ROS:  Review of Systems  Constitutional:  Negative for fever.  Gastrointestinal:  Positive for abdominal pain and constipation. Negative for diarrhea.  Genitourinary:  Negative for dysuria.  Musculoskeletal:  Negative for myalgias.  Neurological:  Negative for headaches.  Review of Systems  Other systems negative   Physical Exam  Physical Exam No data found. Constitutional: Well-developed, well-nourished female in no acute distress.  Cardiovascular: normal rate Respiratory: normal effort GI: Abd soft, non-tender. Pos BS x 4 MS: Extremities nontender, no edema, normal ROM Neurologic: Alert and oriented x 4.  GU: Neg CVAT.  PELVIC EXAM: Bimanual exam: Cervix 0/long/high, firm, anterior, neg CMT, uterus nontender, nonenlarged, adnexa without tenderness, enlargement, or mass  LAB  RESULTS hCG, Beta Chain, Quant, S <5 mIU/mL 164,312 High     WBC 4.0 - 10.5 K/uL 5.5  5.5 R   RBC 3.87 - 5.11 MIL/uL 4.51  4.81 R   Hemoglobin 12.0 - 15.0 g/dL 62.9 Low   52.8 R   HCT 36.0 - 46.0 % 34.2 Low   37.8 R   MCV 80.0 - 100.0 fL 75.8 Low   79 R   MCH 26.0 - 34.0 pg 25.3 Low   25.8 Low  R   MCHC 30.0 - 36.0 g/dL 41.3  24.4 R   RDW 01.0 - 15.5 % 14.2  13.0 R   Platelets 150 - 400 K/uL 309  300 R       IMAGING US OB Comp Less 14 Wks  Result Date:  12/27/2021 CLINICAL DATA:  Abdominal pain. LMP: 11/23/2021 corresponding to an estimated gestational age of [redacted] weeks, 5 days. EXAM: OBSTETRIC <14 WK ULTRASOUND TECHNIQUE: Transabdominal ultrasound was performed for evaluation of the gestation as well as the maternal uterus and adnexal regions. COMPARISON:  None. FINDINGS: Intrauterine gestational sac: Single intrauterine gestational sac. Yolk sac:  Seen Embryo:  Present Cardiac Activity: Detected Heart Rate: 169 bpm CRL:   33 mm   10 w 1 d                  [place] EDC: 08/12/2022 Subchorionic hemorrhage: A 1.4 x 1.9 x 2.0 cm hypoechoic focus may represent a small subchorionic hemorrhage. Maternal uterus/adnexae: The right ovary is unremarkable. There is a 3 cm cyst in the left ovary. IMPRESSION: Single live intrauterine pregnancy with an estimated gestational age of [redacted] weeks, 1 day. Electronically Signed   By: Elgie Collard M.D.   On: 12/27/2021 00:16   US Abdomen Limited RUQ (LIVER/GB)  Result Date: 12/26/2021 CLINICAL DATA:  Right upper quadrant pain. EXAM: ULTRASOUND ABDOMEN LIMITED RIGHT UPPER QUADRANT COMPARISON:  None. FINDINGS: Gallbladder: No gallstones or wall thickening visualized. No sonographic Murphy sign noted by sonographer. Common bile duct: Diameter: 3 mm, normal. Liver: No focal lesion identified. Within normal limits in parenchymal echogenicity. Portal vein is patent on color Doppler imaging with normal direction of blood flow towards the liver. Other: None. IMPRESSION: 1. Normal right upper quadrant ultrasound. Electronically Signed   By: Obie Dredge M.D.   On: 12/26/2021 20:53    MAU Management/MDM: Ordered usual first trimester r/o ectopic labs.   Pelvic exam and cultures done Will check baseline Ultrasound to rule out ectopic.  This bleeding/pain can represent a normal pregnancy with bleeding, spontaneous abortion or even an ectopic which can be life-threatening.  The process as listed above helps to determine which of these is  present.  RUQ US done in ED, normal  Pregnancy of unknown location workup done here Showed live viable IUP  Will Rx miralax prn  ASSESSMENT 1. Pelvic pain affecting pregnancy in first trimester, antepartum   2. RUQ abdominal pain   3. Pregnancy of unknown anatomic location   4. Constipation, unspecified constipation type     PLAN Discharge home Bowel care discussed Rx Miralax prn constipation List of OB providers   Follow-up Information     [facility]. Schedule an appointment as soon as possible for a visit.   Specialty: Obstetrics and Gynecology Contact information: [address], [address] [place] [address] [phone]               Pt stable at time of discharge.  Encouraged to return here if she develops worsening of symptoms, increase in pain, fever, or other concerning symptoms.    Wynelle BourgeoisMarie Lakisha Peyser CNM, MSN Certified Nurse-Midwife 12/31/2021  11:45 AM

## 2022-01-16 ENCOUNTER — Encounter (HOSPITAL_COMMUNITY): Payer: Self-pay

## 2022-01-16 ENCOUNTER — Other Ambulatory Visit: Payer: Self-pay

## 2022-01-16 ENCOUNTER — Inpatient Hospital Stay (HOSPITAL_COMMUNITY)
Admission: AD | Admit: 2022-01-16 | Discharge: 2022-01-16 | Disposition: A | Discharge status: Home / Self Care | Payer: Medicaid Other | Attending: Obstetrics and Gynecology | Admitting: Obstetrics and Gynecology

## 2022-01-16 DIAGNOSIS — R112 Nausea with vomiting, unspecified: Secondary | ICD-10-CM

## 2022-01-16 DIAGNOSIS — O09521 Supervision of elderly multigravida, first trimester: Secondary | ICD-10-CM | POA: Insufficient documentation

## 2022-01-16 DIAGNOSIS — Z3A13 13 weeks gestation of pregnancy: Secondary | ICD-10-CM | POA: Insufficient documentation

## 2022-01-16 DIAGNOSIS — O219 Vomiting of pregnancy, unspecified: Secondary | ICD-10-CM | POA: Diagnosis present

## 2022-01-16 HISTORY — DX: Other specified health status: Z78.9

## 2022-01-16 LAB — URINALYSIS, ROUTINE W REFLEX MICROSCOPIC
Bilirubin Urine: NEGATIVE
Glucose, UA: NEGATIVE mg/dL
Hgb urine dipstick: NEGATIVE
Ketones, ur: NEGATIVE mg/dL
Leukocytes,Ua: NEGATIVE
Nitrite: NEGATIVE
Protein, ur: NEGATIVE mg/dL
Specific Gravity, Urine: 1.01 (ref 1.005–1.030)
pH: 7 (ref 5.0–8.0)

## 2022-01-16 MED ORDER — FAMOTIDINE 20 MG PO TABS: 20.0000 mg | ORAL_TABLET | Freq: Two times a day (BID) | ORAL | 1 refills | Status: AC

## 2022-01-16 MED ORDER — ONDANSETRON HCL 8 MG PO TABS: 8.0000 mg | ORAL_TABLET | Freq: Three times a day (TID) | ORAL | 1 refills | Status: AC | PRN

## 2022-01-16 MED ORDER — ONDANSETRON 4 MG PO TBDP
8.0000 mg | ORAL_TABLET | Freq: Once | ORAL | Status: AC
  Administered 2022-01-16: 8 mg via ORAL
  Filled 2022-01-16: qty 2

## 2022-01-16 NOTE — ED Provider Notes (Signed)
Emergency Medicine Provider OB Triage Evaluation Note  Katrina Hurley is a 41 y.o. female, G3P2002, at [redacted]w[redacted]d gestation who presents to the emergency department with complaints of nausea, vomiting and generalized weakness.  Currently [redacted] weeks pregnant reports she has been having increasing difficulties with morning sickness with this pregnancy compared to her 2 previous pregnancies.  Reports she has not been able to keep much of anything down and has been vomiting at least once every day.  Reports some mild generalized abdominal discomfort.  No vaginal bleeding or leakage of fluids..  Review of  Systems  Positive: Abdominal pain, nausea, vomiting Negative: Fevers, vaginal bleeding, leakage of fluids  Physical Exam  BP 117/76 (BP Location: Left Arm)    Pulse 82    Temp 98.6 F (37 C) (Oral)    Resp 14    LMP 11/23/2021    SpO2 100%  General: Awake, no distress  HEENT: Atraumatic  Resp: Normal effort  Cardiac: Normal rate Abd: Nondistended, nontender  MSK: Moves all extremities without difficulty Neuro: Speech clear  Medical Decision Making  Pt evaluated for pregnancy concern and is stable for transfer to MAU. Pt is in agreement with plan for transfer.  11:41 AM Discussed with MAU APP, Shawna Orleans, who accepts patient in transfer.  Clinical Impression   1. Nausea and vomiting, unspecified vomiting type        Legrand Rams 01/16/22 1147    Glendora Score, MD 01/16/22 1726

## 2022-01-16 NOTE — MAU Note (Signed)
Presents with c/o N/V since [redacted] weeks pregnant, states it's interfering with work.  States able to keep liquids down, but vomits solids.

## 2022-01-16 NOTE — ED Triage Notes (Signed)
Pt arrived POV from home c/o of wrosen N?V for 3 days. Pt is [redacted] weeks pregnant. Pt denies any abdominal pain, bleeding, or discharge.

## 2022-01-16 NOTE — MAU Provider Note (Signed)
°  History     CSN: [id number]  Arrival date and time: 01/16/22 1133   Event Date/Time   First Provider Initiated Contact with Patient 01/16/22 1416      Chief Complaint  Patient presents with   Nausea   Emesis   HPI  [name] is a 41 y.o. female G3P2002 here in MAU with complaints of N/V. The symptoms have been present for a few weeks. She reports being able to keep down liquids, however is not able to keep down food. She has tried soup which is the one thing she can keep down. She reports the nausea is interfering with her job. She has not tried anything OTC or prescribed for the symptoms.   OB History     Gravida  3   Para  2   Term  2   Preterm      AB      Living  2      SAB      IAB      Ectopic      Multiple      Live Births  2           Past Medical History:  Diagnosis Date   Medical history non-contributory     Past Surgical History:  Procedure Laterality Date   CESAREAN SECTION  07/2010   INGUINAL HERNIA REPAIR  2018   SHOULDER SURGERY Left 2019    History reviewed. No pertinent family history.  Social History   Tobacco Use   Smoking status: Former    Types: Cigarettes    Quit date: 2012    Years since quitting: 11.0   Smokeless tobacco: Never  Vaping Use   Vaping Use: Never used  Substance Use Topics   Alcohol use: Not Currently   Drug use: Not Currently    Allergies:  Allergies  Allergen Reactions   Prunus Persica Anaphylaxis   Shellfish Allergy Anaphylaxis   Shellfish-Derived Products     No medications prior to admission.   Review of Systems  Constitutional:  Negative for fever.  Gastrointestinal:  Positive for nausea and vomiting. Negative for abdominal pain.  Genitourinary:  Negative for vaginal bleeding and vaginal discharge.  Physical Exam   Blood pressure 107/73, pulse 85, temperature 98.1 F (36.7 C), temperature source Oral, resp. rate 19, height 5\' 4"  (1.626 m), weight 78.9 kg, last menstrual  period 11/23/2021, SpO2 100 %.  Physical Exam Vitals and nursing note reviewed.  Constitutional:      General: She is not in acute distress.    Appearance: Normal appearance. She is not ill-appearing, toxic-appearing or diaphoretic.  HENT:     Head: Normocephalic.  Skin:    General: Skin is warm.  Neurological:     Mental Status: She is alert and oriented to person, place, and time.  Psychiatric:        Behavior: Behavior normal.   MAU Course  Procedures  MDM  + fetal heart tones via doppler  Zofran 8 mg ODT Urine without signs of dehydration Patient feeling much better and tolerating oral fluids.  Assessment and Plan   A:  Nausea and vomiting, unspecified vomiting type - Plan: Discharge patient  [redacted] weeks gestation of pregnancy - Plan: Discharge patient   P:  Discharge home Rx: Zofran, Pepcid Return to MAU if symptoms worsen Bland diet  Spencer Peterkin, 14/09/2021, NP 01/16/2022 4:14 PM

## 2022-04-29 ENCOUNTER — Inpatient Hospital Stay (HOSPITAL_COMMUNITY)
Admission: AD | Admit: 2022-04-29 | Discharge: 2022-04-29 | Disposition: A | Discharge status: Home / Self Care | Payer: Medicaid Other | Attending: Obstetrics and Gynecology | Admitting: Obstetrics and Gynecology

## 2022-04-29 ENCOUNTER — Encounter (HOSPITAL_COMMUNITY): Payer: Self-pay | Admitting: Obstetrics and Gynecology

## 2022-04-29 DIAGNOSIS — Z3A27 27 weeks gestation of pregnancy: Secondary | ICD-10-CM

## 2022-04-29 DIAGNOSIS — O09522 Supervision of elderly multigravida, second trimester: Secondary | ICD-10-CM | POA: Insufficient documentation

## 2022-04-29 DIAGNOSIS — O0932 Supervision of pregnancy with insufficient antenatal care, second trimester: Secondary | ICD-10-CM

## 2022-04-29 DIAGNOSIS — R102 Pelvic and perineal pain: Secondary | ICD-10-CM

## 2022-04-29 DIAGNOSIS — O26892 Other specified pregnancy related conditions, second trimester: Secondary | ICD-10-CM | POA: Diagnosis not present

## 2022-04-29 DIAGNOSIS — N949 Unspecified condition associated with female genital organs and menstrual cycle: Secondary | ICD-10-CM

## 2022-04-29 DIAGNOSIS — O2342 Unspecified infection of urinary tract in pregnancy, second trimester: Secondary | ICD-10-CM | POA: Diagnosis present

## 2022-04-29 DIAGNOSIS — Z98891 History of uterine scar from previous surgery: Secondary | ICD-10-CM

## 2022-04-29 DIAGNOSIS — R635 Abnormal weight gain: Secondary | ICD-10-CM

## 2022-04-29 LAB — URINALYSIS, ROUTINE W REFLEX MICROSCOPIC
Bilirubin Urine: NEGATIVE
Glucose, UA: NEGATIVE mg/dL
Hgb urine dipstick: NEGATIVE
Ketones, ur: NEGATIVE mg/dL
Leukocytes,Ua: NEGATIVE
Nitrite: NEGATIVE
Protein, ur: NEGATIVE mg/dL
Specific Gravity, Urine: 1.002 — ABNORMAL LOW (ref 1.005–1.030)
pH: 6 (ref 5.0–8.0)

## 2022-04-29 NOTE — Discharge Instructions (Addendum)
Prenatal Care Providers ? ?         ?[facility] ? [place] ?[phone] ? ?[facility] [place] [facility] [place]  ? [address]  ?([place] ? ?[place] [facility]      ? [address] ?[phone]   ?         ?[facility] [place] [facility] [place]    ? [address] ?[phone] ?         ?[facility] [place] [facility] [place]  ? [address] ?[phone] ? ?[facility] ? [address] ?[phone] ?    ?[facility] [place] [facility] [place]) ? [place] [phone] ?    ?[facility] Department  ?Phone: [phone] ? ? ? ?PREGNANCY SUPPORT BELT: ?You are not alone, Seventy-five percent of women have some sort of abdominal or back pain at some point in their pregnancy. Your baby is growing at a fast pace, which means that your whole body is rapidly trying to adjust to the changes. As your uterus grows, your back may start feeling a bit under stress and this can result in back or abdominal pain that can go from mild, and therefore bearable, to severe pains that will not allow you to sit or lay down comfortably, When it comes to dealing with pregnancy-related pains and cramps, some pregnant women usually prefer natural remedies, which the market is filled with nowadays. For example, wearing a pregnancy support belt can help ease and lessen your discomfort and pain. ?WHAT ARE THE BENEFITS OF WEARING A PREGNANCY SUPPORT BELT? A pregnancy support belt provides support to the lower portion of the belly taking some of the weight of the growing uterus and distributing to the other parts of your body. It is designed make you comfortable and gives you extra support. Over the years, the pregnancy apparel market has been studying the needs and wants of pregnant women and they have come up with the most  comfortable pregnancy support belts that woman could ever ask for. In fact, you will no longer have to wear a stretched-out or bulky pregnancy belt that is visible underneath your clothes and makes you feel even more uncomfortable. Nowadays, a pregnancy support belt is made of comfortable and stretchy materials that will not irritate your skin but will actually make you feel at ease and you will not even notice you are wearing it. They are easy to put on and adjust during the day and can be worn at night for additional support.  ?BENEFITS: ?Relives Back pain ?Relieves Abdominal Muscle and Leg Pain ?Stabilizes the Pelvic Ring ?Offers a Cushioned Abdominal Lift Pad ?Relieves pressure on the Sciatic Nerve Within Minutes ? ?WHERE TO GET YOUR PREGNANCY BELT: [employer] [phone] @[phone]  [place], [place] [address] ? ?[facility] ? ?[place], [place], [place] [address] ? ?[phone] ? ?[facility] ? ?[address] [place], [place] [address] ? ?[phone] ? ?[employer] ? ?[place],  [address] ? ?[phone] ? ?[employer] ? ?[address], [place],  [address] ? ?[phone] ? ?

## 2022-04-29 NOTE — MAU Provider Note (Signed)
?History  ?  ? ?CSN: [id number] ? ?Arrival date and time: 04/29/22 1854 ? ? Event Date/Time  ? First Provider Initiated Contact with Patient 04/29/22 1938   ?  ? ?Chief Complaint  ?Patient presents with  ? pelvic pressure  ? Back Pain  ? ?Katrina Hurley 41 y.o. [id number] at [redacted]w[redacted]d presents to MAU with complaints of " pelvic pain and pressure and walking slower." Pt states pain was a 7/10 while standing at work; Unrelieved by eating and drinking. States "no pain since sitting here in MAU."  She also endorses intermittent Leg and thigh cramping while standing and with sudden movements. Attempted PO Tylenol and potassium rich foods with some relief.  She endorses urinary frequency. Denies burning with urination or urgency. Pt seen by PCP for UTI 2 days ago and has started ABX. Water intake- 4 bottles of water today however has only had an apple since lunch. She denies VB LOF, or contractions. +FM.   ? ? ?OB History   ? ? Gravida  ?3  ? Para  ?2  ? Term  ?2  ? Preterm  ?   ? AB  ?   ? Living  ?2  ?  ? ? SAB  ?   ? IAB  ?   ? Ectopic  ?   ? Multiple  ?   ? Live Births  ?2  ?   ?  ?  ? ? ?Past Medical History:  ?Diagnosis Date  ? Medical history non-contributory   ? ? ?Past Surgical History:  ?Procedure Laterality Date  ? CESAREAN SECTION  07/2010  ? INGUINAL HERNIA REPAIR  2018  ? SHOULDER SURGERY Left 2019  ? ?has Weight gain; DUB (dysfunctional uterine bleeding); Pelvic pain; Breast pain; Visit for routine gyn exam; Ovarian cyst; History of C-section; and Late prenatal care affecting pregnancy in second trimester on their problem list.  ?History reviewed. No pertinent family history. ? ?Social History  ? ?Tobacco Use  ? Smoking status: Former  ?  Types: Cigarettes  ?  Quit date: 2012  ?  Years since quitting: 11.3  ? Smokeless tobacco: Never  ?Vaping Use  ? Vaping Use: Never used  ?Substance Use Topics  ? Alcohol use: Not Currently  ? Drug use: Not Currently  ? ? ?Allergies:  ?Allergies  ?Allergen Reactions  ? Prunus Persica  Anaphylaxis  ? Shellfish Allergy Anaphylaxis  ? Shellfish-Derived Products   ? ? ?Medications Prior to Admission  ?Medication Sig Dispense Refill Last Dose  ? nitrofurantoin (MACRODANTIN) 100 MG capsule Take 100 mg by mouth 4 (four) times daily.   04/29/2022  ? Prenatal Vit-Fe Fumarate-FA (MULTIVITAMIN-PRENATAL) 27-0.8 MG TABS tablet Take 1 tablet by mouth daily at 12 noon.   04/29/2022  ? famotidine (PEPCID) 20 MG tablet Take 1 tablet (20 mg total) by mouth 2 (two) times daily. 60 tablet 1   ? Multiple Vitamins-Minerals (MULTIVITAMIN GUMMIES WOMENS) CHEW Chew 1 tablet by mouth daily.     ? ondansetron (ZOFRAN) 8 MG tablet Take 1 tablet (8 mg total) by mouth every 8 (eight) hours as needed for nausea or vomiting. 20 tablet 1   ? polyethylene glycol (MIRALAX) 17 g packet Take 17 g by mouth daily. 14 each 0   ? ? ?Review of Systems  ?Constitutional:  Negative for appetite change, chills and fatigue.  ?Respiratory:  Negative for apnea and shortness of breath.   ?Gastrointestinal:  Negative for abdominal pain, nausea and vomiting.  ?Genitourinary:  Positive for  pelvic pain. Negative for flank pain, urgency, vaginal bleeding, vaginal discharge and vaginal pain.  ?Psychiatric/Behavioral:  Negative for suicidal ideas.   ?Physical Exam  ? ?Blood pressure 110/71, pulse 79, temperature 98.2 ?F (36.8 ?C), resp. rate 17, height 5\' 4"  (1.626 m), weight 85.3 kg, last menstrual period 11/23/2021, SpO2 100 %. ? ?Physical Exam ?Vitals and nursing note reviewed. Exam conducted with a chaperone present.  ?Constitutional:   ?   General: She is not in acute distress. ?   Appearance: Normal appearance.  ?HENT:  ?   Head: Normocephalic.  ?Cardiovascular:  ?   Rate and Rhythm: Normal rate and regular rhythm.  ?Pulmonary:  ?   Effort: Pulmonary effort is normal. No respiratory distress.  ?Abdominal:  ?   Tenderness: There is no guarding.  ?   Comments: Pregnant   ?Genitourinary: ?   General: Normal vulva.  ?   Vagina: Vaginal discharge  present.  ?   Comments: White normal appearing discharge  ?Musculoskeletal:  ?   Cervical back: Normal range of motion.  ?Skin: ?   General: Skin is warm and dry.  ?Neurological:  ?   Mental Status: She is alert and oriented to person, place, and time.  ?Psychiatric:     ?   Mood and Affect: Mood normal.     ?   Behavior: Behavior normal.  ? ? ?MAU Course  ?Procedures ?Pt currently being treated for UTI.  ? ?MDM ?FHT: Category I  ?Baseline 145bpm ?Moderate Variability  ?Accels present  ?Decels absent  ?Occasional Contractions- Pt unaware  ? ?SVE: 0.5/thick/ ballottable  ?Exam by S. Rollene Rotunda CNM  ? ?Low suspicion for SOL.   ? ?Assessment and Plan  ?Round Ligament Pain/ Pelvic Pressure  ?- Recommended for Maternity support belt for pelvic pressure. ?- Continue ABX tx for UTI as prescribed.   ?- Pt may take PO OTC tylenol PRN for pain ?- Encouraged to increase PO intake  ?- Visit scheduled for Anatomy US ASAP ?- Msg sent for NOB with MD ASAP ?- Return Precaution reviewed   ?- Pt discharged in stable condition  ? ?Jacquiline Doe CNM  ?04/29/2022, 7:38 PM  ?

## 2022-04-29 NOTE — MAU Note (Addendum)
.  Katrina Hurley is a 41 y.o. at [redacted]w[redacted]d here in MAU reporting: pelvic pain and pressure today. Some upper back pain and milk leaking from left breast. Stands constantly at work. Last OB appt in [place] in Dec. Now lives in [place] and has medicaid now. Needs to find OB here. Good FM. Denies VB or LOF. States her PCP started her on antibiotics for uti and on second day of meds ?LMP: [facility] 07/24/22 ?Onset of complaint: today ?Pain score: 7 (Not bad when sitting but worse when stands) ?Vitals:  ? 04/29/22 1905 04/29/22 1907  ?BP:  99/61  ?Pulse: 80   ?Resp: 17   ?Temp: 98.2 ?F (36.8 ?C)   ?SpO2: 100%   ?   ?FHT:138 ?Lab orders placed from triage:  u/a ? ?

## 2022-04-29 NOTE — Progress Notes (Signed)
Written and verbal discharge instructions given to pt. Pt verbalized understanding.  

## 2022-04-30 ENCOUNTER — Other Ambulatory Visit: Payer: Self-pay | Admitting: *Deleted

## 2022-04-30 DIAGNOSIS — O0990 Supervision of high risk pregnancy, unspecified, unspecified trimester: Secondary | ICD-10-CM

## 2022-04-30 NOTE — Progress Notes (Signed)
Anatomy scan changed to detail per protocol. ?

## 2022-05-27 ENCOUNTER — Other Ambulatory Visit (HOSPITAL_COMMUNITY)
Admission: RE | Admit: 2022-05-27 | Discharge: 2022-05-27 | Disposition: A | Discharge status: Home / Self Care | Payer: Medicaid Other | Source: Ambulatory Visit | Attending: Obstetrics & Gynecology | Admitting: Obstetrics & Gynecology

## 2022-05-27 ENCOUNTER — Other Ambulatory Visit: Payer: Medicaid Other

## 2022-05-27 ENCOUNTER — Encounter: Payer: Self-pay | Admitting: Obstetrics & Gynecology

## 2022-05-27 ENCOUNTER — Ambulatory Visit (INDEPENDENT_AMBULATORY_CARE_PROVIDER_SITE_OTHER): Payer: Medicaid Other | Admitting: Obstetrics & Gynecology

## 2022-05-27 ENCOUNTER — Other Ambulatory Visit: Payer: Self-pay | Admitting: Obstetrics & Gynecology

## 2022-05-27 VITALS — BP 99/64 | HR 78 | Wt 185.0 lb

## 2022-05-27 DIAGNOSIS — Z98891 History of uterine scar from previous surgery: Secondary | ICD-10-CM

## 2022-05-27 DIAGNOSIS — O0990 Supervision of high risk pregnancy, unspecified, unspecified trimester: Secondary | ICD-10-CM | POA: Insufficient documentation

## 2022-05-27 DIAGNOSIS — O0993 Supervision of high risk pregnancy, unspecified, third trimester: Secondary | ICD-10-CM

## 2022-05-27 DIAGNOSIS — O09523 Supervision of elderly multigravida, third trimester: Secondary | ICD-10-CM | POA: Diagnosis not present

## 2022-05-27 DIAGNOSIS — O0932 Supervision of pregnancy with insufficient antenatal care, second trimester: Secondary | ICD-10-CM | POA: Diagnosis not present

## 2022-05-27 DIAGNOSIS — Z3A31 31 weeks gestation of pregnancy: Secondary | ICD-10-CM | POA: Diagnosis not present

## 2022-05-27 NOTE — Progress Notes (Signed)
  Subjective:late PNC, previous CS    Katrina Hurley is a [id number] [redacted]w[redacted]d being seen today for her first obstetrical visit.  Her obstetrical history is significant for advanced maternal age and Previous CSfor abruption . Patient does intend to breast feed. Pregnancy history fully reviewed.  Patient reports no complaints.  Vitals:   05/27/22 0948  BP: 99/64  Pulse: 78  Weight: 185 lb (83.9 kg)    HISTORY: OB History  Gravida Para Term Preterm AB Living  3 2 2     2   SAB IAB Ectopic Multiple Live Births          2    # Outcome Date GA Lbr Len/2nd Weight Sex Delivery Anes PTL Lv  3 Current           2 Term 07/17/10 [redacted]w[redacted]d  7 lb 7.9 oz (3.4 kg) F CS-LTranv   LIV     Complications: Abruptio Placenta  1 Term 12/02/08 [redacted]w[redacted]d  8 lb 2.5 oz (3.7 kg) F Vag-Spont   LIV   Past Medical History:  Diagnosis Date   Medical history non-contributory    Past Surgical History:  Procedure Laterality Date   CESAREAN SECTION  07/2010   INGUINAL HERNIA REPAIR  2018   SHOULDER SURGERY Left 2019   No family history on file.   Exam    Uterus:   32  Pelvic Exam:    Perineum:    Vulva:    Vagina:     pH:    Cervix:    Adnexa:    Bony Pelvis:   System: Breast:     Skin: normal coloration and turgor, no rashes    Neurologic: oriented, normal mood   Extremities: normal strength, tone, and muscle mass   HEENT PERRLA   Mouth/Teeth mucous membranes moist, pharynx normal without lesions   Neck supple   Cardiovascular: regular rate and rhythm   Respiratory:  appears well, vitals normal, no respiratory distress, acyanotic, normal RR, neck free of mass or lymphadenopathy   Abdomen: soft, non-tender; bowel sounds normal; no masses,  no organomegaly   Urinary:       Assessment:    Pregnancy: 2020 Patient Active Problem List   Diagnosis Date Noted   Supervision of high risk pregnancy, antepartum 05/27/2022   History of C-section 04/29/2022   Late prenatal care affecting pregnancy in second  trimester 04/29/2022   Visit for routine gyn exam 07/10/2020   Ovarian cyst 07/10/2020   Weight gain 05/10/2020   DUB (dysfunctional uterine bleeding) 05/10/2020   Pelvic pain 05/10/2020   Breast pain 05/10/2020        Plan:     Initial labs drawn. Prenatal vitamins. Problem list reviewed and updated. Genetic Screening discussed : declined.  Ultrasound discussed; fetal survey: ordered.  Follow up in 2 weeks. 50% of 30 min visit spent on counseling and coordination of care.  2 hr GTT   05/12/2020 05/27/2022

## 2022-05-27 NOTE — Progress Notes (Signed)
NEW OB, she is not fasting today, will need to reschedule for 2 Hr. GTT.

## 2022-05-28 ENCOUNTER — Ambulatory Visit: Payer: Medicaid Other

## 2022-05-28 LAB — CBC/D/PLT+RPR+RH+ABO+RUBIGG...
Antibody Screen: NEGATIVE
Basophils Absolute: 0 10*3/uL (ref 0.0–0.2)
Basos: 0 %
EOS (ABSOLUTE): 0 10*3/uL (ref 0.0–0.4)
Eos: 1 %
HCV Ab: NONREACTIVE
HIV Screen 4th Generation wRfx: NONREACTIVE
Hematocrit: 29.2 % — ABNORMAL LOW (ref 34.0–46.6)
Hemoglobin: 9.7 g/dL — ABNORMAL LOW (ref 11.1–15.9)
Hepatitis B Surface Ag: NEGATIVE
Immature Grans (Abs): 0.1 10*3/uL (ref 0.0–0.1)
Immature Granulocytes: 1 %
Lymphocytes Absolute: 0.8 10*3/uL (ref 0.7–3.1)
Lymphs: 14 %
MCH: 25.2 pg — ABNORMAL LOW (ref 26.6–33.0)
MCHC: 33.2 g/dL (ref 31.5–35.7)
MCV: 76 fL — ABNORMAL LOW (ref 79–97)
Monocytes Absolute: 0.7 10*3/uL (ref 0.1–0.9)
Monocytes: 11 %
Neutrophils Absolute: 4.4 10*3/uL (ref 1.4–7.0)
Neutrophils: 73 %
Platelets: 273 10*3/uL (ref 150–450)
RBC: 3.85 x10E6/uL (ref 3.77–5.28)
RDW: 12.9 % (ref 11.7–15.4)
RPR Ser Ql: NONREACTIVE
Rh Factor: POSITIVE
Rubella Antibodies, IGG: 7.41 index (ref >0.99)
WBC: 6 10*3/uL (ref 3.4–10.8)

## 2022-05-28 LAB — HCV INTERPRETATION

## 2022-05-28 LAB — CERVICOVAGINAL ANCILLARY ONLY
Chlamydia: NEGATIVE
Comment: NEGATIVE
Comment: NORMAL
Neisseria Gonorrhea: NEGATIVE

## 2022-05-29 LAB — CULTURE, OB URINE

## 2022-05-29 LAB — URINE CULTURE, OB REFLEX

## 2022-06-11 ENCOUNTER — Other Ambulatory Visit: Payer: Medicaid Other

## 2022-06-11 ENCOUNTER — Encounter: Payer: Medicaid Other | Admitting: Student

## 2022-06-24 ENCOUNTER — Ambulatory Visit: Payer: Medicaid Other | Admitting: *Deleted

## 2022-06-24 ENCOUNTER — Ambulatory Visit: Payer: Medicaid Other | Attending: Obstetrics & Gynecology

## 2022-06-24 VITALS — BP 105/68 | HR 83

## 2022-06-24 DIAGNOSIS — O0932 Supervision of pregnancy with insufficient antenatal care, second trimester: Secondary | ICD-10-CM

## 2022-06-24 DIAGNOSIS — Z98891 History of uterine scar from previous surgery: Secondary | ICD-10-CM

## 2022-06-24 DIAGNOSIS — O0990 Supervision of high risk pregnancy, unspecified, unspecified trimester: Secondary | ICD-10-CM | POA: Diagnosis not present

## 2022-07-08 ENCOUNTER — Telehealth: Payer: Self-pay

## 2022-07-08 ENCOUNTER — Encounter: Payer: Medicaid Other | Admitting: Obstetrics and Gynecology

## 2022-07-08 NOTE — Telephone Encounter (Signed)
The patient was contacted in order to reschedule the appointment that she missed on 07/08/22.  The informed the patient that we needed to get her scheduled.  The patient was informed that she had not been seen since her New OB visit.  The patient was also told that she needed to do her 36 week swabs to make sure that she doesn't have GBS.  The patient adamantly denied wanting to be seen.  I reiterated to the patient again about being seen to do her 36 week swabs.  The patient again declined to be scheduled.

## 2022-07-22 ENCOUNTER — Telehealth: Payer: Self-pay

## 2022-07-23 ENCOUNTER — Telehealth: Payer: Self-pay | Admitting: *Deleted

## 2022-07-23 NOTE — Telephone Encounter (Signed)
Pt called to office this morning- LVM stating she thinks her water broke and she is having ctx. Call placed to pt.  Pt states she woke up this morning, pants were wet and she has been having ctx since.  Pt was advised to be evaluated at hospital for ?ROM.  Pt states understanding.

## 2022-10-16 ENCOUNTER — Telehealth: Payer: Self-pay | Admitting: *Deleted

## 2022-10-16 NOTE — Telephone Encounter (Signed)
TC from [place] collecting information for a grievance filed by pt. Caller confirmed pt's DOB and name. Advised of dates of service at [facility] and of ED, MAU, and MFM visits and of TC to try to get pt scheduled for appt.

## 2023-07-20 IMAGING — US US OB COMP LESS 14 WK
1 series · 15 of 28 positions shown · non-contrast
Comparison: None.

CLINICAL DATA: Abdominal pain. LMP: 11/23/2021 corresponding to an
estimated gestational age of 4 weeks, 5 days.

EXAM:
OBSTETRIC <14 WK ULTRASOUND
TECHNIQUE: Transabdominal ultrasound was performed for evaluation of the
gestation as well as the maternal uterus and adnexal regions.

[Series 1: us ob comp less 14 wk · 48 acquisitions, 15 frames shown]
[im 1/48]
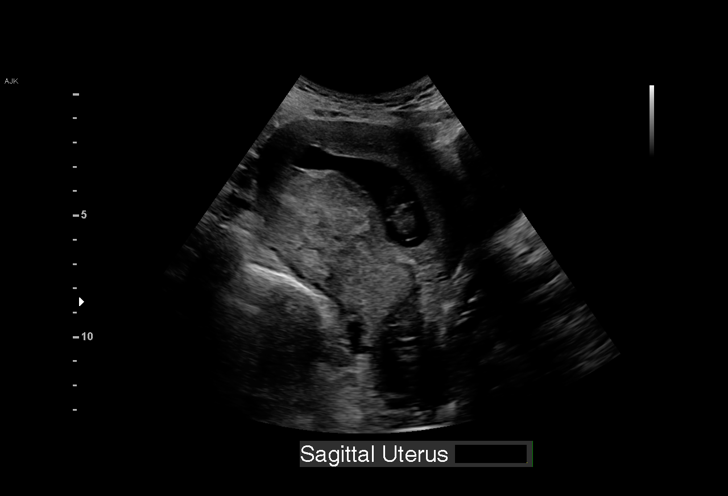
[im 4/48]
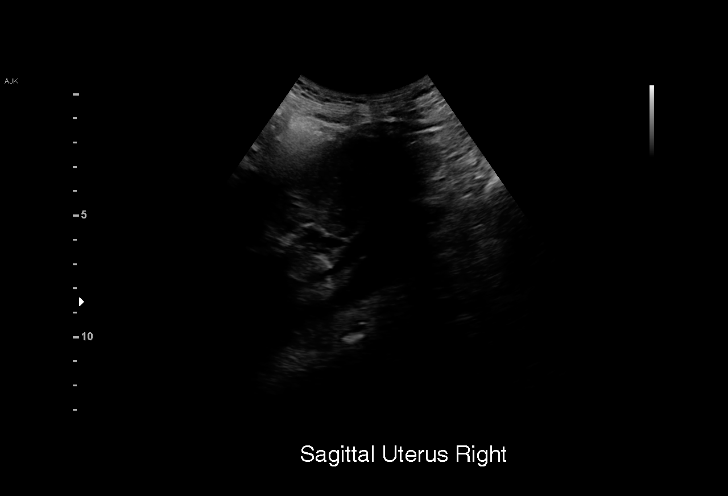
[im 7/48]
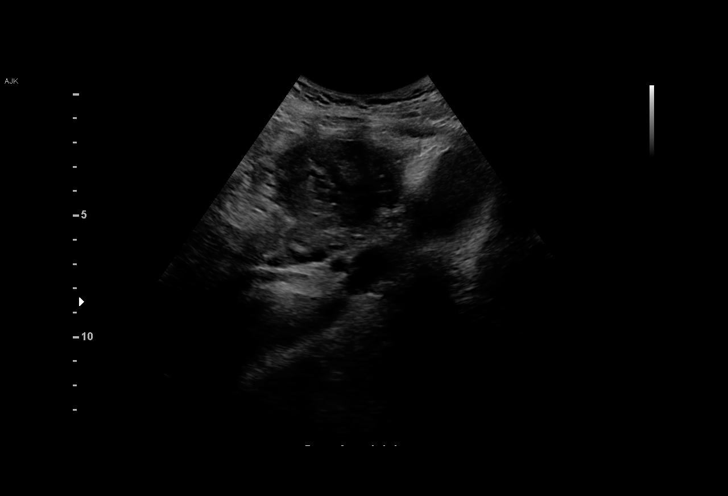
[im 11/48]
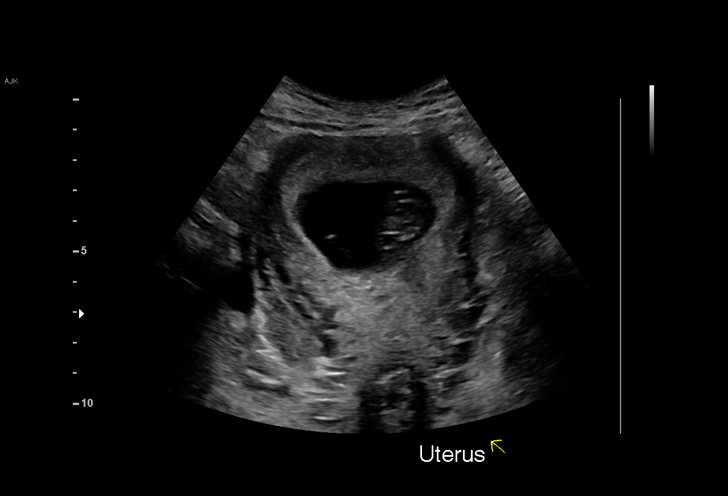
[im 14/48]
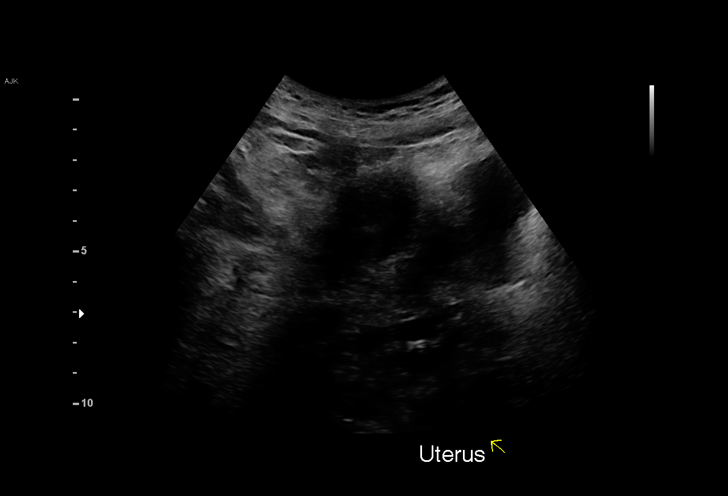
[im 18/48]
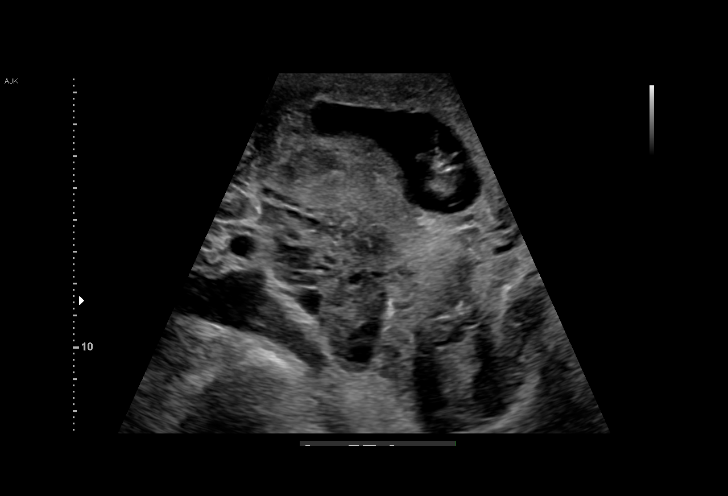
[im 21/48]
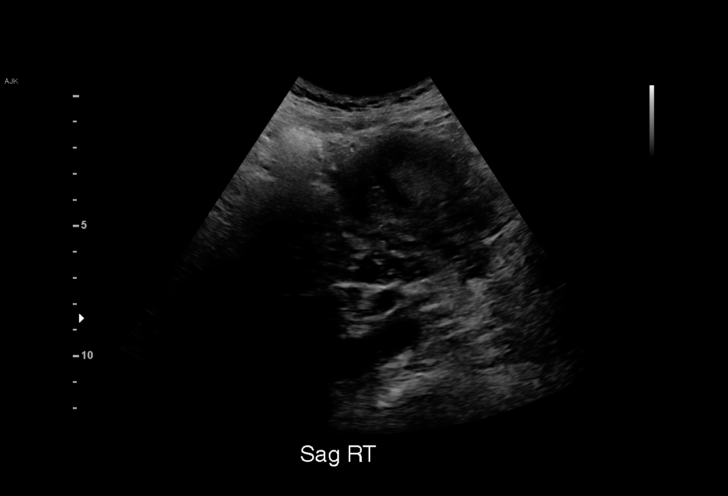
[im 25/48]
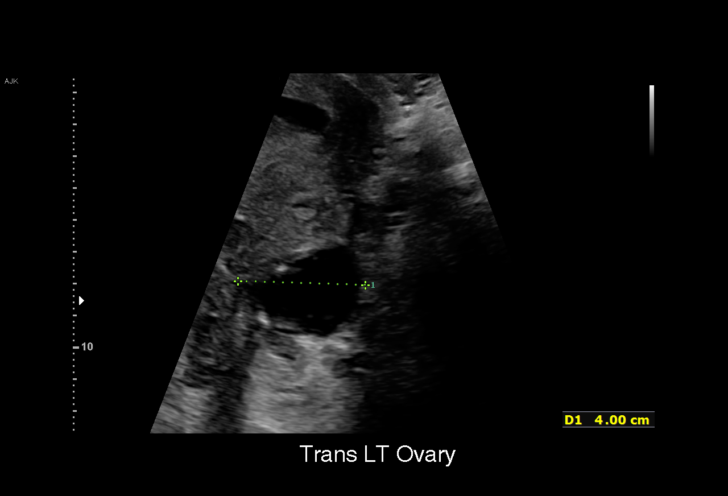
[im 27/48]
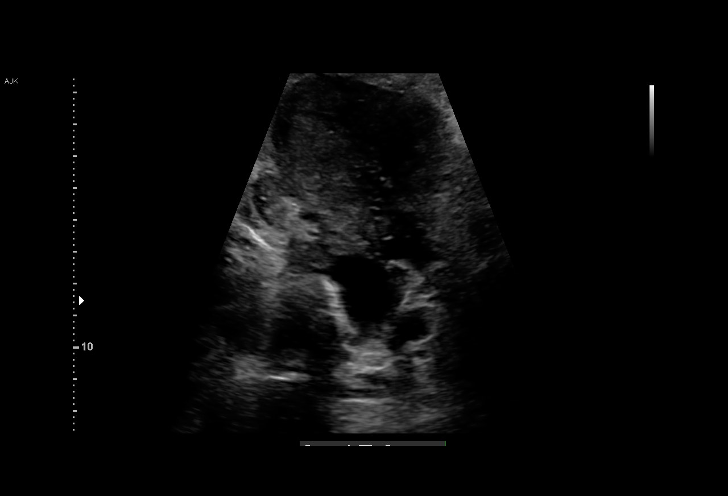
[im 30/48]
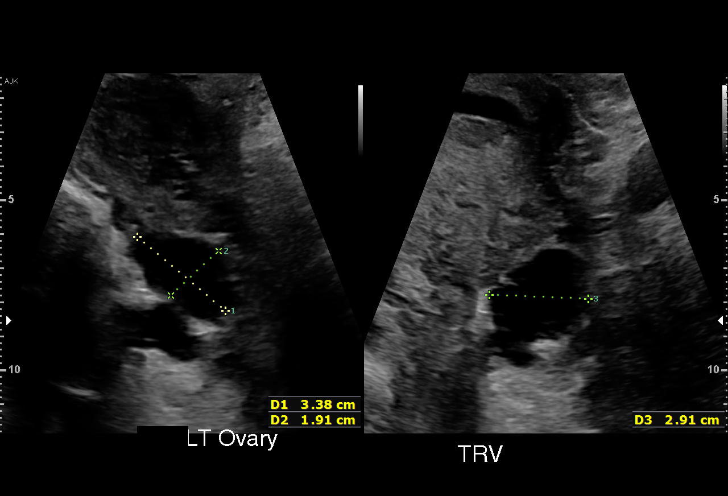
[im 34/48]
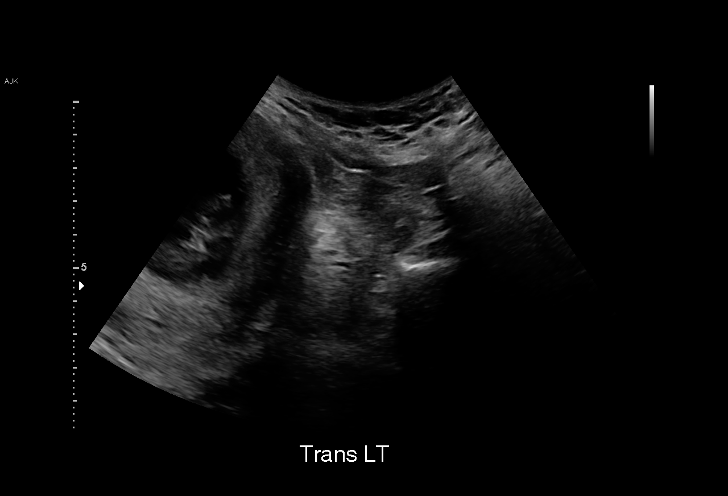
[im 37/48]
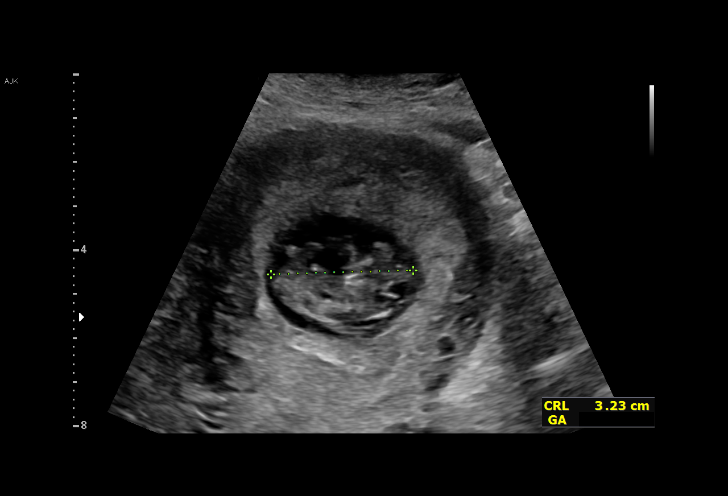
[im 41/48]
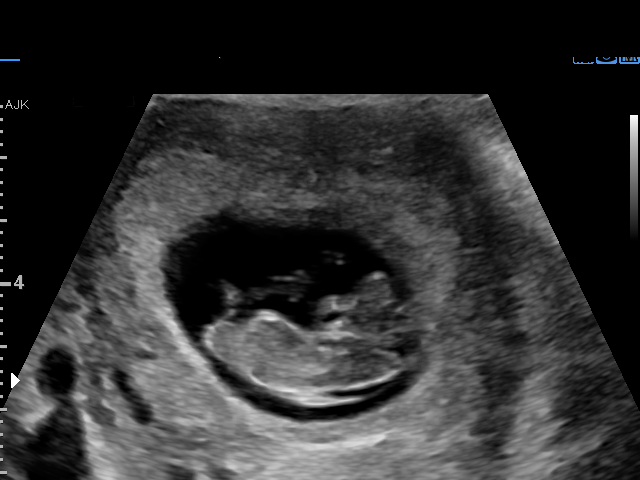
[im 44/48]
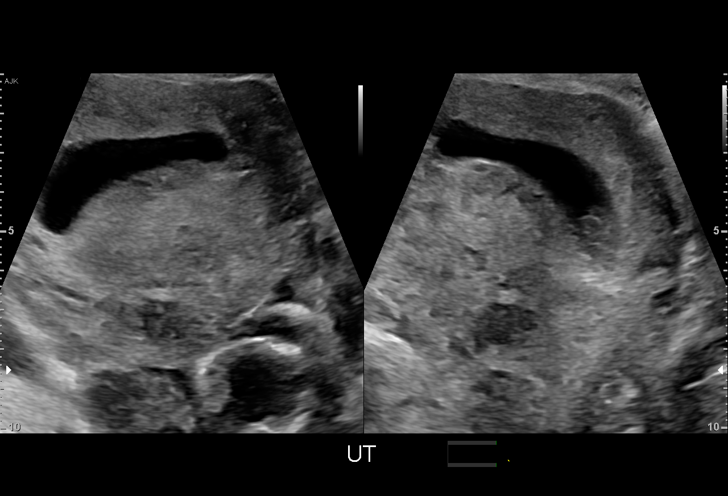
[im 48/48]
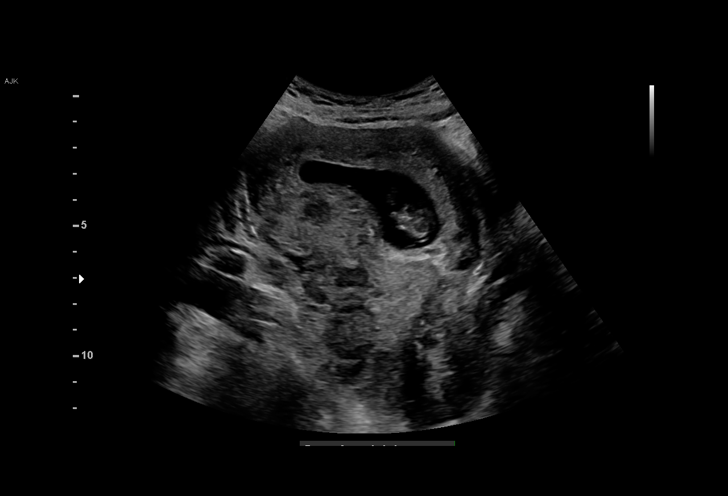

[15 of 28 positions shown; findings below may reference images not displayed]

FINDINGS: Intrauterine gestational sac: Single intrauterine gestational sac.

Yolk sac:  Seen

Embryo:  Present

Cardiac Activity: Detected

Heart Rate: 169 bpm

CRL:   33 mm   10 w 1 d                  US EDC: 08/12/2022

Subchorionic hemorrhage: A 1.4 x 1.9 x 2.0 cm hypoechoic focus may
represent a small subchorionic hemorrhage.

Maternal uterus/adnexae: The right ovary is unremarkable. There is a
3 cm cyst in the left ovary.
IMPRESSION: Single live intrauterine pregnancy with an estimated gestational age
of 7 weeks, 1 day.
